# Patient Record
Sex: Female | Born: 1955
Health system: Southern US, Community
[De-identification: ages and names within clinical notes are randomized; demographics above are authoritative.]

## PROBLEM LIST (undated history)

## (undated) DIAGNOSIS — F32A Depression, unspecified: Secondary | ICD-10-CM

## (undated) DIAGNOSIS — Z8701 Personal history of pneumonia (recurrent): Secondary | ICD-10-CM

## (undated) DIAGNOSIS — E785 Hyperlipidemia, unspecified: Secondary | ICD-10-CM

## (undated) DIAGNOSIS — F909 Attention-deficit hyperactivity disorder, unspecified type: Secondary | ICD-10-CM

## (undated) DIAGNOSIS — Z8709 Personal history of other diseases of the respiratory system: Secondary | ICD-10-CM

## (undated) DIAGNOSIS — F419 Anxiety disorder, unspecified: Secondary | ICD-10-CM

## (undated) DIAGNOSIS — M1990 Unspecified osteoarthritis, unspecified site: Secondary | ICD-10-CM

## (undated) DIAGNOSIS — T7840XA Allergy, unspecified, initial encounter: Secondary | ICD-10-CM

## (undated) HISTORY — DX: Anxiety disorder, unspecified: F41.9

## (undated) HISTORY — PX: HEEL SPUR SURGERY: SHX665

## (undated) HISTORY — DX: Allergy, unspecified, initial encounter: T78.40XA

## (undated) HISTORY — DX: Hyperlipidemia, unspecified: E78.5

## (undated) HISTORY — DX: Depression, unspecified: F32.A

## (undated) HISTORY — PX: APPENDECTOMY: SHX54

## (undated) HISTORY — DX: Unspecified osteoarthritis, unspecified site: M19.90

---

## 1998-01-10 LAB — HM HEPATITIS C SCREENING LAB: HM Hepatitis Screen: NEGATIVE

## 1998-01-10 LAB — HM HIV SCREENING LAB: HM HIV Screening: NEGATIVE

## 1998-08-18 ENCOUNTER — Other Ambulatory Visit: Admission: RE | Admit: 1998-08-18 | Discharge: 1998-08-18 | Payer: Self-pay | Admitting: Gynecology

## 2001-01-16 ENCOUNTER — Other Ambulatory Visit: Admission: RE | Admit: 2001-01-16 | Discharge: 2001-01-16 | Payer: Self-pay | Admitting: Gynecology

## 2004-05-12 ENCOUNTER — Ambulatory Visit (HOSPITAL_COMMUNITY): Admission: RE | Admit: 2004-05-12 | Discharge: 2004-05-12 | Payer: Self-pay | Admitting: Family Medicine

## 2004-05-12 ENCOUNTER — Observation Stay (HOSPITAL_COMMUNITY): Admission: RE | Admit: 2004-05-12 | Discharge: 2004-05-13 | Payer: Self-pay | Admitting: *Deleted

## 2004-08-11 ENCOUNTER — Other Ambulatory Visit: Admission: RE | Admit: 2004-08-11 | Discharge: 2004-08-11 | Payer: Self-pay | Admitting: Family Medicine

## 2005-02-02 ENCOUNTER — Encounter: Admission: RE | Admit: 2005-02-02 | Discharge: 2005-02-02 | Payer: Self-pay | Admitting: Family Medicine

## 2005-02-18 ENCOUNTER — Encounter: Admission: RE | Admit: 2005-02-18 | Discharge: 2005-02-18 | Payer: Self-pay | Admitting: Family Medicine

## 2010-12-10 ENCOUNTER — Other Ambulatory Visit: Payer: Self-pay | Admitting: Obstetrics and Gynecology

## 2014-08-06 ENCOUNTER — Other Ambulatory Visit (HOSPITAL_COMMUNITY)
Admission: RE | Admit: 2014-08-06 | Discharge: 2014-08-06 | Disposition: A | Discharge status: Home / Self Care | Payer: BLUE CROSS/BLUE SHIELD | Source: Ambulatory Visit | Attending: Family Medicine | Admitting: Family Medicine

## 2014-08-06 ENCOUNTER — Other Ambulatory Visit: Payer: Self-pay | Admitting: Family Medicine

## 2014-08-06 DIAGNOSIS — Z01411 Encounter for gynecological examination (general) (routine) with abnormal findings: Secondary | ICD-10-CM | POA: Insufficient documentation

## 2014-08-07 LAB — CYTOLOGY - PAP

## 2015-04-13 DIAGNOSIS — M545 Low back pain: Secondary | ICD-10-CM | POA: Diagnosis not present

## 2015-04-13 DIAGNOSIS — M9902 Segmental and somatic dysfunction of thoracic region: Secondary | ICD-10-CM | POA: Diagnosis not present

## 2015-04-13 DIAGNOSIS — M9901 Segmental and somatic dysfunction of cervical region: Secondary | ICD-10-CM | POA: Diagnosis not present

## 2015-04-13 DIAGNOSIS — M9903 Segmental and somatic dysfunction of lumbar region: Secondary | ICD-10-CM | POA: Diagnosis not present

## 2015-04-15 DIAGNOSIS — M9902 Segmental and somatic dysfunction of thoracic region: Secondary | ICD-10-CM | POA: Diagnosis not present

## 2015-04-15 DIAGNOSIS — M9901 Segmental and somatic dysfunction of cervical region: Secondary | ICD-10-CM | POA: Diagnosis not present

## 2015-04-15 DIAGNOSIS — M9903 Segmental and somatic dysfunction of lumbar region: Secondary | ICD-10-CM | POA: Diagnosis not present

## 2015-04-15 DIAGNOSIS — M545 Low back pain: Secondary | ICD-10-CM | POA: Diagnosis not present

## 2015-05-13 DIAGNOSIS — C44529 Squamous cell carcinoma of skin of other part of trunk: Secondary | ICD-10-CM | POA: Diagnosis not present

## 2015-05-13 DIAGNOSIS — B079 Viral wart, unspecified: Secondary | ICD-10-CM | POA: Diagnosis not present

## 2015-08-13 DIAGNOSIS — D225 Melanocytic nevi of trunk: Secondary | ICD-10-CM | POA: Diagnosis not present

## 2015-08-13 DIAGNOSIS — L570 Actinic keratosis: Secondary | ICD-10-CM | POA: Diagnosis not present

## 2015-08-24 ENCOUNTER — Other Ambulatory Visit: Payer: Self-pay | Admitting: Family Medicine

## 2015-08-24 DIAGNOSIS — Z1231 Encounter for screening mammogram for malignant neoplasm of breast: Secondary | ICD-10-CM

## 2015-08-24 DIAGNOSIS — F322 Major depressive disorder, single episode, severe without psychotic features: Secondary | ICD-10-CM | POA: Diagnosis not present

## 2015-08-24 DIAGNOSIS — E7800 Pure hypercholesterolemia, unspecified: Secondary | ICD-10-CM | POA: Diagnosis not present

## 2015-08-24 DIAGNOSIS — Z0000 Encounter for general adult medical examination without abnormal findings: Secondary | ICD-10-CM | POA: Diagnosis not present

## 2015-08-24 DIAGNOSIS — Z1239 Encounter for other screening for malignant neoplasm of breast: Secondary | ICD-10-CM | POA: Diagnosis not present

## 2015-09-07 ENCOUNTER — Encounter: Payer: Self-pay | Admitting: Radiology

## 2015-09-07 ENCOUNTER — Ambulatory Visit
Admission: RE | Admit: 2015-09-07 | Discharge: 2015-09-07 | Disposition: A | Discharge status: Home / Self Care | Payer: BLUE CROSS/BLUE SHIELD | Source: Ambulatory Visit | Attending: Family Medicine | Admitting: Family Medicine

## 2015-09-07 DIAGNOSIS — Z1231 Encounter for screening mammogram for malignant neoplasm of breast: Secondary | ICD-10-CM

## 2015-10-29 DIAGNOSIS — Z79899 Other long term (current) drug therapy: Secondary | ICD-10-CM | POA: Diagnosis not present

## 2015-10-29 DIAGNOSIS — E7800 Pure hypercholesterolemia, unspecified: Secondary | ICD-10-CM | POA: Diagnosis not present

## 2015-11-24 DIAGNOSIS — S93601A Unspecified sprain of right foot, initial encounter: Secondary | ICD-10-CM | POA: Diagnosis not present

## 2015-11-30 DIAGNOSIS — F322 Major depressive disorder, single episode, severe without psychotic features: Secondary | ICD-10-CM | POA: Diagnosis not present

## 2015-11-30 DIAGNOSIS — F411 Generalized anxiety disorder: Secondary | ICD-10-CM | POA: Diagnosis not present

## 2016-01-10 DIAGNOSIS — R5383 Other fatigue: Secondary | ICD-10-CM | POA: Diagnosis not present

## 2016-01-10 DIAGNOSIS — J209 Acute bronchitis, unspecified: Secondary | ICD-10-CM | POA: Diagnosis not present

## 2016-02-02 DIAGNOSIS — F322 Major depressive disorder, single episode, severe without psychotic features: Secondary | ICD-10-CM | POA: Diagnosis not present

## 2016-09-19 DIAGNOSIS — Z79899 Other long term (current) drug therapy: Secondary | ICD-10-CM | POA: Diagnosis not present

## 2016-09-19 DIAGNOSIS — E7800 Pure hypercholesterolemia, unspecified: Secondary | ICD-10-CM | POA: Diagnosis not present

## 2016-09-19 DIAGNOSIS — Z0000 Encounter for general adult medical examination without abnormal findings: Secondary | ICD-10-CM | POA: Diagnosis not present

## 2016-10-12 ENCOUNTER — Other Ambulatory Visit: Payer: Self-pay | Admitting: Family Medicine

## 2016-10-12 DIAGNOSIS — Z1231 Encounter for screening mammogram for malignant neoplasm of breast: Secondary | ICD-10-CM

## 2016-10-26 ENCOUNTER — Ambulatory Visit
Admission: RE | Admit: 2016-10-26 | Discharge: 2016-10-26 | Disposition: A | Discharge status: Home / Self Care | Payer: BLUE CROSS/BLUE SHIELD | Source: Ambulatory Visit | Attending: Family Medicine | Admitting: Family Medicine

## 2016-10-26 DIAGNOSIS — Z1231 Encounter for screening mammogram for malignant neoplasm of breast: Secondary | ICD-10-CM | POA: Diagnosis not present

## 2017-03-01 DIAGNOSIS — L853 Xerosis cutis: Secondary | ICD-10-CM | POA: Diagnosis not present

## 2017-03-16 DIAGNOSIS — J00 Acute nasopharyngitis [common cold]: Secondary | ICD-10-CM | POA: Diagnosis not present

## 2017-05-18 DIAGNOSIS — F17200 Nicotine dependence, unspecified, uncomplicated: Secondary | ICD-10-CM | POA: Diagnosis not present

## 2017-05-18 DIAGNOSIS — Z79899 Other long term (current) drug therapy: Secondary | ICD-10-CM | POA: Diagnosis not present

## 2017-05-18 DIAGNOSIS — E871 Hypo-osmolality and hyponatremia: Secondary | ICD-10-CM | POA: Diagnosis not present

## 2017-05-18 DIAGNOSIS — F411 Generalized anxiety disorder: Secondary | ICD-10-CM | POA: Diagnosis not present

## 2017-05-18 DIAGNOSIS — E7800 Pure hypercholesterolemia, unspecified: Secondary | ICD-10-CM | POA: Diagnosis not present

## 2017-05-18 DIAGNOSIS — F322 Major depressive disorder, single episode, severe without psychotic features: Secondary | ICD-10-CM | POA: Diagnosis not present

## 2017-05-23 ENCOUNTER — Other Ambulatory Visit: Payer: Self-pay | Admitting: Family Medicine

## 2017-05-23 ENCOUNTER — Ambulatory Visit
Admission: RE | Admit: 2017-05-23 | Discharge: 2017-05-23 | Disposition: A | Discharge status: Home / Self Care | Payer: BLUE CROSS/BLUE SHIELD | Source: Ambulatory Visit | Attending: Family Medicine | Admitting: Family Medicine

## 2017-05-23 DIAGNOSIS — E871 Hypo-osmolality and hyponatremia: Secondary | ICD-10-CM | POA: Diagnosis not present

## 2017-05-23 DIAGNOSIS — R05 Cough: Secondary | ICD-10-CM | POA: Diagnosis not present

## 2017-10-24 ENCOUNTER — Other Ambulatory Visit: Payer: Self-pay | Admitting: Family Medicine

## 2017-10-24 ENCOUNTER — Other Ambulatory Visit (HOSPITAL_COMMUNITY)
Admission: RE | Admit: 2017-10-24 | Discharge: 2017-10-24 | Disposition: A | Discharge status: Home / Self Care | Payer: BLUE CROSS/BLUE SHIELD | Source: Ambulatory Visit | Attending: Family Medicine | Admitting: Family Medicine

## 2017-10-24 DIAGNOSIS — Z0000 Encounter for general adult medical examination without abnormal findings: Secondary | ICD-10-CM | POA: Diagnosis not present

## 2017-10-24 DIAGNOSIS — Z01419 Encounter for gynecological examination (general) (routine) without abnormal findings: Secondary | ICD-10-CM | POA: Diagnosis not present

## 2017-10-24 DIAGNOSIS — Z124 Encounter for screening for malignant neoplasm of cervix: Secondary | ICD-10-CM | POA: Diagnosis not present

## 2017-10-24 DIAGNOSIS — E7800 Pure hypercholesterolemia, unspecified: Secondary | ICD-10-CM | POA: Diagnosis not present

## 2017-10-24 DIAGNOSIS — Z79899 Other long term (current) drug therapy: Secondary | ICD-10-CM | POA: Diagnosis not present

## 2017-10-25 ENCOUNTER — Other Ambulatory Visit: Payer: Self-pay | Admitting: Family Medicine

## 2017-10-25 DIAGNOSIS — Z1231 Encounter for screening mammogram for malignant neoplasm of breast: Secondary | ICD-10-CM

## 2017-10-30 LAB — CYTOLOGY - PAP: Diagnosis: NEGATIVE

## 2017-11-29 ENCOUNTER — Ambulatory Visit
Admission: RE | Admit: 2017-11-29 | Discharge: 2017-11-29 | Disposition: A | Discharge status: Home / Self Care | Payer: BLUE CROSS/BLUE SHIELD | Source: Ambulatory Visit | Attending: Family Medicine | Admitting: Family Medicine

## 2017-11-29 DIAGNOSIS — Z1231 Encounter for screening mammogram for malignant neoplasm of breast: Secondary | ICD-10-CM | POA: Diagnosis not present

## 2017-12-11 DIAGNOSIS — F322 Major depressive disorder, single episode, severe without psychotic features: Secondary | ICD-10-CM | POA: Diagnosis not present

## 2017-12-11 DIAGNOSIS — F411 Generalized anxiety disorder: Secondary | ICD-10-CM | POA: Diagnosis not present

## 2017-12-14 DIAGNOSIS — F4320 Adjustment disorder, unspecified: Secondary | ICD-10-CM | POA: Diagnosis not present

## 2017-12-20 DIAGNOSIS — F331 Major depressive disorder, recurrent, moderate: Secondary | ICD-10-CM | POA: Diagnosis not present

## 2017-12-26 DIAGNOSIS — F331 Major depressive disorder, recurrent, moderate: Secondary | ICD-10-CM | POA: Diagnosis not present

## 2018-01-08 DIAGNOSIS — F331 Major depressive disorder, recurrent, moderate: Secondary | ICD-10-CM | POA: Diagnosis not present

## 2018-01-15 DIAGNOSIS — F331 Major depressive disorder, recurrent, moderate: Secondary | ICD-10-CM | POA: Diagnosis not present

## 2018-02-28 DIAGNOSIS — D485 Neoplasm of uncertain behavior of skin: Secondary | ICD-10-CM | POA: Diagnosis not present

## 2018-02-28 DIAGNOSIS — D2262 Melanocytic nevi of left upper limb, including shoulder: Secondary | ICD-10-CM | POA: Diagnosis not present

## 2018-02-28 DIAGNOSIS — D2261 Melanocytic nevi of right upper limb, including shoulder: Secondary | ICD-10-CM | POA: Diagnosis not present

## 2018-02-28 DIAGNOSIS — D225 Melanocytic nevi of trunk: Secondary | ICD-10-CM | POA: Diagnosis not present

## 2018-02-28 DIAGNOSIS — D2272 Melanocytic nevi of left lower limb, including hip: Secondary | ICD-10-CM | POA: Diagnosis not present

## 2018-02-28 DIAGNOSIS — C44519 Basal cell carcinoma of skin of other part of trunk: Secondary | ICD-10-CM | POA: Diagnosis not present

## 2018-04-02 DIAGNOSIS — K045 Chronic apical periodontitis: Secondary | ICD-10-CM | POA: Diagnosis not present

## 2018-06-06 DIAGNOSIS — E7800 Pure hypercholesterolemia, unspecified: Secondary | ICD-10-CM | POA: Diagnosis not present

## 2018-06-06 DIAGNOSIS — F322 Major depressive disorder, single episode, severe without psychotic features: Secondary | ICD-10-CM | POA: Diagnosis not present

## 2018-06-06 DIAGNOSIS — F17200 Nicotine dependence, unspecified, uncomplicated: Secondary | ICD-10-CM | POA: Diagnosis not present

## 2018-06-06 DIAGNOSIS — F411 Generalized anxiety disorder: Secondary | ICD-10-CM | POA: Diagnosis not present

## 2018-07-26 DIAGNOSIS — C44519 Basal cell carcinoma of skin of other part of trunk: Secondary | ICD-10-CM | POA: Diagnosis not present

## 2018-11-13 DIAGNOSIS — Z0000 Encounter for general adult medical examination without abnormal findings: Secondary | ICD-10-CM | POA: Diagnosis not present

## 2019-01-30 DIAGNOSIS — L570 Actinic keratosis: Secondary | ICD-10-CM | POA: Diagnosis not present

## 2019-01-30 DIAGNOSIS — D2261 Melanocytic nevi of right upper limb, including shoulder: Secondary | ICD-10-CM | POA: Diagnosis not present

## 2019-01-30 DIAGNOSIS — D2262 Melanocytic nevi of left upper limb, including shoulder: Secondary | ICD-10-CM | POA: Diagnosis not present

## 2019-01-30 DIAGNOSIS — X32XXXA Exposure to sunlight, initial encounter: Secondary | ICD-10-CM | POA: Diagnosis not present

## 2019-01-30 DIAGNOSIS — D225 Melanocytic nevi of trunk: Secondary | ICD-10-CM | POA: Diagnosis not present

## 2019-01-30 DIAGNOSIS — Z85828 Personal history of other malignant neoplasm of skin: Secondary | ICD-10-CM | POA: Diagnosis not present

## 2019-02-13 DIAGNOSIS — Z20822 Contact with and (suspected) exposure to covid-19: Secondary | ICD-10-CM | POA: Diagnosis not present

## 2019-10-23 ENCOUNTER — Other Ambulatory Visit: Payer: Self-pay | Admitting: Family Medicine

## 2019-10-23 DIAGNOSIS — Z1231 Encounter for screening mammogram for malignant neoplasm of breast: Secondary | ICD-10-CM

## 2019-11-25 ENCOUNTER — Ambulatory Visit: Payer: BLUE CROSS/BLUE SHIELD

## 2019-11-26 ENCOUNTER — Ambulatory Visit
Admission: RE | Admit: 2019-11-26 | Discharge: 2019-11-26 | Disposition: A | Discharge status: Home / Self Care | Payer: BLUE CROSS/BLUE SHIELD | Source: Ambulatory Visit | Attending: Family Medicine | Admitting: Family Medicine

## 2019-11-26 ENCOUNTER — Other Ambulatory Visit: Payer: Self-pay

## 2019-11-26 DIAGNOSIS — Z1231 Encounter for screening mammogram for malignant neoplasm of breast: Secondary | ICD-10-CM

## 2019-12-02 ENCOUNTER — Other Ambulatory Visit: Payer: Self-pay | Admitting: Family Medicine

## 2019-12-02 DIAGNOSIS — F17200 Nicotine dependence, unspecified, uncomplicated: Secondary | ICD-10-CM | POA: Diagnosis not present

## 2019-12-02 DIAGNOSIS — F411 Generalized anxiety disorder: Secondary | ICD-10-CM | POA: Diagnosis not present

## 2019-12-02 DIAGNOSIS — F322 Major depressive disorder, single episode, severe without psychotic features: Secondary | ICD-10-CM | POA: Diagnosis not present

## 2019-12-02 DIAGNOSIS — Z79899 Other long term (current) drug therapy: Secondary | ICD-10-CM | POA: Diagnosis not present

## 2019-12-02 DIAGNOSIS — R928 Other abnormal and inconclusive findings on diagnostic imaging of breast: Secondary | ICD-10-CM

## 2019-12-02 DIAGNOSIS — E7800 Pure hypercholesterolemia, unspecified: Secondary | ICD-10-CM | POA: Diagnosis not present

## 2019-12-12 ENCOUNTER — Ambulatory Visit
Admission: RE | Admit: 2019-12-12 | Discharge: 2019-12-12 | Disposition: A | Discharge status: Home / Self Care | Payer: BC Managed Care – PPO | Source: Ambulatory Visit | Attending: Family Medicine | Admitting: Family Medicine

## 2019-12-12 ENCOUNTER — Other Ambulatory Visit: Payer: Self-pay

## 2019-12-12 DIAGNOSIS — N6001 Solitary cyst of right breast: Secondary | ICD-10-CM | POA: Diagnosis not present

## 2019-12-12 DIAGNOSIS — R928 Other abnormal and inconclusive findings on diagnostic imaging of breast: Secondary | ICD-10-CM

## 2020-09-07 ENCOUNTER — Other Ambulatory Visit: Payer: Self-pay

## 2020-09-07 ENCOUNTER — Ambulatory Visit (INDEPENDENT_AMBULATORY_CARE_PROVIDER_SITE_OTHER): Payer: 59

## 2020-09-07 ENCOUNTER — Encounter: Payer: Self-pay | Admitting: Podiatry

## 2020-09-07 ENCOUNTER — Ambulatory Visit (INDEPENDENT_AMBULATORY_CARE_PROVIDER_SITE_OTHER): Payer: 59 | Admitting: Podiatry

## 2020-09-07 ENCOUNTER — Other Ambulatory Visit: Payer: Self-pay | Admitting: Podiatry

## 2020-09-07 DIAGNOSIS — M722 Plantar fascial fibromatosis: Secondary | ICD-10-CM

## 2020-09-07 DIAGNOSIS — M778 Other enthesopathies, not elsewhere classified: Secondary | ICD-10-CM

## 2020-09-07 MED ORDER — TRIAMCINOLONE ACETONIDE 40 MG/ML IJ SUSP
20.0000 mg | Freq: Once | INTRAMUSCULAR | Status: AC
  Administered 2020-09-07: 20 mg

## 2020-09-07 MED ORDER — MELOXICAM 15 MG PO TABS: 15.0000 mg | ORAL_TABLET | Freq: Every day | ORAL | 3 refills | Status: DC

## 2020-09-07 MED ORDER — METHYLPREDNISOLONE 4 MG PO TBPK: ORAL_TABLET | ORAL | 0 refills | Status: DC

## 2020-09-07 NOTE — Patient Instructions (Signed)

## 2020-09-07 NOTE — Progress Notes (Signed)
  Subjective:  Patient ID: [name], female    DOB: 09/29/1955,  MRN: [id number] HPI Chief Complaint  Patient presents with   Foot Pain    Plantar heel left - aching x 3 weeks, AM pain, intermittent intensity, tried advil and ice, some better, wearing supportive shoes   New Patient (Initial Visit)    65 y.o. female presents with the above complaint.   ROS: Denies fever chills nausea vomiting muscle aches pains calf pain back pain chest pain shortness of breath.  No past medical history on file. No past surgical history on file.  Current Outpatient Medications:    meloxicam (MOBIC) 15 MG tablet, Take 1 tablet (15 mg total) by mouth daily., Disp: 30 tablet, Rfl: 3   methylPREDNISolone (MEDROL DOSEPAK) 4 MG TBPK tablet, 6 day dose pack - take as directed, Disp: 21 tablet, Rfl: 0   albuterol (VENTOLIN HFA) 108 (90 Base) MCG/ACT inhaler, Inhale 2 puffs into the lungs every 4 (four) hours as needed., Disp: , Rfl:    escitalopram (LEXAPRO) 20 MG tablet, Take 20 mg by mouth daily., Disp: , Rfl:    lovastatin (MEVACOR) 10 MG tablet, Take 10 mg by mouth daily., Disp: , Rfl:   Allergies  Allergen Reactions   Penicillins    Review of Systems Objective:  There were no vitals filed for this visit.  General: Well developed, nourished, in no acute distress, alert and oriented x3   Dermatological: Skin is warm, dry and supple bilateral. Nails x 10 are well maintained; remaining integument appears unremarkable at this time. There are no open sores, no preulcerative lesions, no rash or signs of infection present.  Vascular: Dorsalis Pedis artery and Posterior Tibial artery pedal pulses are 2/4 bilateral with immedate capillary fill time. Pedal hair growth present. No varicosities and no lower extremity edema present bilateral.   Neruologic: Grossly intact via light touch bilateral. Vibratory intact via tuning fork bilateral. Protective threshold with Semmes Wienstein monofilament intact to all  pedal sites bilateral. Patellar and Achilles deep tendon reflexes 2+ bilateral. No Babinski or clonus noted bilateral.   Musculoskeletal: No gross boney pedal deformities bilateral. No pain, crepitus, or limitation noted with foot and ankle range of motion bilateral. Muscular strength 5/5 in all groups tested bilateral.  She has pain on palpation medial calcaneal tubercle of the left heel no pain on medial lateral compression of the calcaneus.  Gait: Unassisted, Nonantalgic.    Radiographs:  Radiographs taken today demonstrate soft tissue increase in density plantar fascial pain insertion site no fractures identified.  Assessment & Plan:   Assessment: Planter fasciitis left  Plan: Discussed etiology pathology conservative versus surgical therapies.  I injected the heel today 20 mg Kenalog 5 mg Marcaine.  Provide her plantar fascial brace.  Provided her with a prescription for methylprednisolone to be followed by meloxicam.  Discussed appropriate shoe gear stretching exercise ice therapy and shoe gear modifications.  Follow-up with her in 1 month     Sander Speckman T. [place], [place]

## 2020-10-07 ENCOUNTER — Ambulatory Visit (INDEPENDENT_AMBULATORY_CARE_PROVIDER_SITE_OTHER): Payer: 59 | Admitting: Podiatry

## 2020-10-07 ENCOUNTER — Other Ambulatory Visit: Payer: Self-pay

## 2020-10-07 ENCOUNTER — Encounter: Payer: Self-pay | Admitting: Podiatry

## 2020-10-07 DIAGNOSIS — M722 Plantar fascial fibromatosis: Secondary | ICD-10-CM | POA: Diagnosis not present

## 2020-10-07 MED ORDER — TRIAMCINOLONE ACETONIDE 40 MG/ML IJ SUSP
20.0000 mg | Freq: Once | INTRAMUSCULAR | Status: AC
  Administered 2020-10-07: 20 mg

## 2020-10-07 NOTE — Progress Notes (Signed)
She presents today for follow-up of her left heel states that the area keeps moving around on the heel where it hurts.  Objective: Vital signs are stable she is alert oriented x3 still has pain on palpation medial calcaneal tubercle of the left heel.  Assessment: Plan fasciitis left heel.  Plan: Reinject the left heel today follow-up with her in 1 month

## 2020-11-25 ENCOUNTER — Encounter: Payer: 59 | Admitting: Podiatry

## 2020-12-30 ENCOUNTER — Encounter: Payer: 59 | Admitting: Podiatry

## 2021-01-18 DIAGNOSIS — E7800 Pure hypercholesterolemia, unspecified: Secondary | ICD-10-CM | POA: Diagnosis not present

## 2021-01-22 ENCOUNTER — Other Ambulatory Visit: Payer: Self-pay | Admitting: Podiatry

## 2021-01-28 DIAGNOSIS — M5416 Radiculopathy, lumbar region: Secondary | ICD-10-CM | POA: Diagnosis not present

## 2021-01-28 DIAGNOSIS — M9902 Segmental and somatic dysfunction of thoracic region: Secondary | ICD-10-CM | POA: Diagnosis not present

## 2021-01-28 DIAGNOSIS — M9903 Segmental and somatic dysfunction of lumbar region: Secondary | ICD-10-CM | POA: Diagnosis not present

## 2021-01-28 DIAGNOSIS — M546 Pain in thoracic spine: Secondary | ICD-10-CM | POA: Diagnosis not present

## 2021-01-28 DIAGNOSIS — M545 Low back pain, unspecified: Secondary | ICD-10-CM | POA: Diagnosis not present

## 2021-01-28 DIAGNOSIS — M542 Cervicalgia: Secondary | ICD-10-CM | POA: Diagnosis not present

## 2021-01-28 DIAGNOSIS — M9901 Segmental and somatic dysfunction of cervical region: Secondary | ICD-10-CM | POA: Diagnosis not present

## 2021-02-08 DIAGNOSIS — M546 Pain in thoracic spine: Secondary | ICD-10-CM | POA: Diagnosis not present

## 2021-02-08 DIAGNOSIS — M9901 Segmental and somatic dysfunction of cervical region: Secondary | ICD-10-CM | POA: Diagnosis not present

## 2021-02-08 DIAGNOSIS — M9902 Segmental and somatic dysfunction of thoracic region: Secondary | ICD-10-CM | POA: Diagnosis not present

## 2021-02-08 DIAGNOSIS — M9903 Segmental and somatic dysfunction of lumbar region: Secondary | ICD-10-CM | POA: Diagnosis not present

## 2021-02-08 DIAGNOSIS — M545 Low back pain, unspecified: Secondary | ICD-10-CM | POA: Diagnosis not present

## 2021-02-09 DIAGNOSIS — M9901 Segmental and somatic dysfunction of cervical region: Secondary | ICD-10-CM | POA: Diagnosis not present

## 2021-02-09 DIAGNOSIS — M9902 Segmental and somatic dysfunction of thoracic region: Secondary | ICD-10-CM | POA: Diagnosis not present

## 2021-02-09 DIAGNOSIS — M9903 Segmental and somatic dysfunction of lumbar region: Secondary | ICD-10-CM | POA: Diagnosis not present

## 2021-02-09 DIAGNOSIS — M546 Pain in thoracic spine: Secondary | ICD-10-CM | POA: Diagnosis not present

## 2021-02-09 DIAGNOSIS — M545 Low back pain, unspecified: Secondary | ICD-10-CM | POA: Diagnosis not present

## 2021-02-11 DIAGNOSIS — M9901 Segmental and somatic dysfunction of cervical region: Secondary | ICD-10-CM | POA: Diagnosis not present

## 2021-02-11 DIAGNOSIS — M545 Low back pain, unspecified: Secondary | ICD-10-CM | POA: Diagnosis not present

## 2021-02-11 DIAGNOSIS — M9903 Segmental and somatic dysfunction of lumbar region: Secondary | ICD-10-CM | POA: Diagnosis not present

## 2021-02-11 DIAGNOSIS — M9902 Segmental and somatic dysfunction of thoracic region: Secondary | ICD-10-CM | POA: Diagnosis not present

## 2021-02-11 DIAGNOSIS — M546 Pain in thoracic spine: Secondary | ICD-10-CM | POA: Diagnosis not present

## 2021-02-16 DIAGNOSIS — M9902 Segmental and somatic dysfunction of thoracic region: Secondary | ICD-10-CM | POA: Diagnosis not present

## 2021-02-16 DIAGNOSIS — M545 Low back pain, unspecified: Secondary | ICD-10-CM | POA: Diagnosis not present

## 2021-02-16 DIAGNOSIS — M546 Pain in thoracic spine: Secondary | ICD-10-CM | POA: Diagnosis not present

## 2021-02-16 DIAGNOSIS — M9903 Segmental and somatic dysfunction of lumbar region: Secondary | ICD-10-CM | POA: Diagnosis not present

## 2021-02-16 DIAGNOSIS — M9901 Segmental and somatic dysfunction of cervical region: Secondary | ICD-10-CM | POA: Diagnosis not present

## 2021-02-17 DIAGNOSIS — M9903 Segmental and somatic dysfunction of lumbar region: Secondary | ICD-10-CM | POA: Diagnosis not present

## 2021-02-17 DIAGNOSIS — M546 Pain in thoracic spine: Secondary | ICD-10-CM | POA: Diagnosis not present

## 2021-02-17 DIAGNOSIS — M545 Low back pain, unspecified: Secondary | ICD-10-CM | POA: Diagnosis not present

## 2021-02-17 DIAGNOSIS — M9902 Segmental and somatic dysfunction of thoracic region: Secondary | ICD-10-CM | POA: Diagnosis not present

## 2021-02-17 DIAGNOSIS — M9901 Segmental and somatic dysfunction of cervical region: Secondary | ICD-10-CM | POA: Diagnosis not present

## 2021-02-18 DIAGNOSIS — M546 Pain in thoracic spine: Secondary | ICD-10-CM | POA: Diagnosis not present

## 2021-02-18 DIAGNOSIS — M9901 Segmental and somatic dysfunction of cervical region: Secondary | ICD-10-CM | POA: Diagnosis not present

## 2021-02-18 DIAGNOSIS — M9903 Segmental and somatic dysfunction of lumbar region: Secondary | ICD-10-CM | POA: Diagnosis not present

## 2021-02-18 DIAGNOSIS — M9902 Segmental and somatic dysfunction of thoracic region: Secondary | ICD-10-CM | POA: Diagnosis not present

## 2021-02-18 DIAGNOSIS — M545 Low back pain, unspecified: Secondary | ICD-10-CM | POA: Diagnosis not present

## 2021-02-23 DIAGNOSIS — M9903 Segmental and somatic dysfunction of lumbar region: Secondary | ICD-10-CM | POA: Diagnosis not present

## 2021-02-23 DIAGNOSIS — M9902 Segmental and somatic dysfunction of thoracic region: Secondary | ICD-10-CM | POA: Diagnosis not present

## 2021-02-23 DIAGNOSIS — M546 Pain in thoracic spine: Secondary | ICD-10-CM | POA: Diagnosis not present

## 2021-02-23 DIAGNOSIS — M9901 Segmental and somatic dysfunction of cervical region: Secondary | ICD-10-CM | POA: Diagnosis not present

## 2021-02-23 DIAGNOSIS — M545 Low back pain, unspecified: Secondary | ICD-10-CM | POA: Diagnosis not present

## 2021-02-25 DIAGNOSIS — M9902 Segmental and somatic dysfunction of thoracic region: Secondary | ICD-10-CM | POA: Diagnosis not present

## 2021-02-25 DIAGNOSIS — M546 Pain in thoracic spine: Secondary | ICD-10-CM | POA: Diagnosis not present

## 2021-02-25 DIAGNOSIS — M9903 Segmental and somatic dysfunction of lumbar region: Secondary | ICD-10-CM | POA: Diagnosis not present

## 2021-02-25 DIAGNOSIS — M545 Low back pain, unspecified: Secondary | ICD-10-CM | POA: Diagnosis not present

## 2021-02-25 DIAGNOSIS — M9901 Segmental and somatic dysfunction of cervical region: Secondary | ICD-10-CM | POA: Diagnosis not present

## 2021-02-26 DIAGNOSIS — M9902 Segmental and somatic dysfunction of thoracic region: Secondary | ICD-10-CM | POA: Diagnosis not present

## 2021-02-26 DIAGNOSIS — M545 Low back pain, unspecified: Secondary | ICD-10-CM | POA: Diagnosis not present

## 2021-02-26 DIAGNOSIS — M546 Pain in thoracic spine: Secondary | ICD-10-CM | POA: Diagnosis not present

## 2021-02-26 DIAGNOSIS — M9903 Segmental and somatic dysfunction of lumbar region: Secondary | ICD-10-CM | POA: Diagnosis not present

## 2021-02-26 DIAGNOSIS — M9901 Segmental and somatic dysfunction of cervical region: Secondary | ICD-10-CM | POA: Diagnosis not present

## 2021-03-02 DIAGNOSIS — M9903 Segmental and somatic dysfunction of lumbar region: Secondary | ICD-10-CM | POA: Diagnosis not present

## 2021-03-02 DIAGNOSIS — M546 Pain in thoracic spine: Secondary | ICD-10-CM | POA: Diagnosis not present

## 2021-03-02 DIAGNOSIS — M9902 Segmental and somatic dysfunction of thoracic region: Secondary | ICD-10-CM | POA: Diagnosis not present

## 2021-03-02 DIAGNOSIS — M545 Low back pain, unspecified: Secondary | ICD-10-CM | POA: Diagnosis not present

## 2021-03-02 DIAGNOSIS — M9901 Segmental and somatic dysfunction of cervical region: Secondary | ICD-10-CM | POA: Diagnosis not present

## 2021-03-03 DIAGNOSIS — M9902 Segmental and somatic dysfunction of thoracic region: Secondary | ICD-10-CM | POA: Diagnosis not present

## 2021-03-03 DIAGNOSIS — M9903 Segmental and somatic dysfunction of lumbar region: Secondary | ICD-10-CM | POA: Diagnosis not present

## 2021-03-03 DIAGNOSIS — M545 Low back pain, unspecified: Secondary | ICD-10-CM | POA: Diagnosis not present

## 2021-03-03 DIAGNOSIS — M546 Pain in thoracic spine: Secondary | ICD-10-CM | POA: Diagnosis not present

## 2021-03-03 DIAGNOSIS — M9901 Segmental and somatic dysfunction of cervical region: Secondary | ICD-10-CM | POA: Diagnosis not present

## 2021-03-04 DIAGNOSIS — M546 Pain in thoracic spine: Secondary | ICD-10-CM | POA: Diagnosis not present

## 2021-03-04 DIAGNOSIS — M9902 Segmental and somatic dysfunction of thoracic region: Secondary | ICD-10-CM | POA: Diagnosis not present

## 2021-03-04 DIAGNOSIS — M545 Low back pain, unspecified: Secondary | ICD-10-CM | POA: Diagnosis not present

## 2021-03-04 DIAGNOSIS — M9903 Segmental and somatic dysfunction of lumbar region: Secondary | ICD-10-CM | POA: Diagnosis not present

## 2021-03-04 DIAGNOSIS — M9901 Segmental and somatic dysfunction of cervical region: Secondary | ICD-10-CM | POA: Diagnosis not present

## 2021-03-09 DIAGNOSIS — M545 Low back pain, unspecified: Secondary | ICD-10-CM | POA: Diagnosis not present

## 2021-03-09 DIAGNOSIS — M9902 Segmental and somatic dysfunction of thoracic region: Secondary | ICD-10-CM | POA: Diagnosis not present

## 2021-03-09 DIAGNOSIS — M9903 Segmental and somatic dysfunction of lumbar region: Secondary | ICD-10-CM | POA: Diagnosis not present

## 2021-03-09 DIAGNOSIS — M9901 Segmental and somatic dysfunction of cervical region: Secondary | ICD-10-CM | POA: Diagnosis not present

## 2021-03-09 DIAGNOSIS — M546 Pain in thoracic spine: Secondary | ICD-10-CM | POA: Diagnosis not present

## 2021-03-10 DIAGNOSIS — M9901 Segmental and somatic dysfunction of cervical region: Secondary | ICD-10-CM | POA: Diagnosis not present

## 2021-03-10 DIAGNOSIS — M545 Low back pain, unspecified: Secondary | ICD-10-CM | POA: Diagnosis not present

## 2021-03-10 DIAGNOSIS — M546 Pain in thoracic spine: Secondary | ICD-10-CM | POA: Diagnosis not present

## 2021-03-10 DIAGNOSIS — M9902 Segmental and somatic dysfunction of thoracic region: Secondary | ICD-10-CM | POA: Diagnosis not present

## 2021-03-10 DIAGNOSIS — M9903 Segmental and somatic dysfunction of lumbar region: Secondary | ICD-10-CM | POA: Diagnosis not present

## 2021-03-16 DIAGNOSIS — M9901 Segmental and somatic dysfunction of cervical region: Secondary | ICD-10-CM | POA: Diagnosis not present

## 2021-03-16 DIAGNOSIS — M546 Pain in thoracic spine: Secondary | ICD-10-CM | POA: Diagnosis not present

## 2021-03-16 DIAGNOSIS — M9903 Segmental and somatic dysfunction of lumbar region: Secondary | ICD-10-CM | POA: Diagnosis not present

## 2021-03-16 DIAGNOSIS — M545 Low back pain, unspecified: Secondary | ICD-10-CM | POA: Diagnosis not present

## 2021-03-16 DIAGNOSIS — M9902 Segmental and somatic dysfunction of thoracic region: Secondary | ICD-10-CM | POA: Diagnosis not present

## 2021-03-18 DIAGNOSIS — M545 Low back pain, unspecified: Secondary | ICD-10-CM | POA: Diagnosis not present

## 2021-03-18 DIAGNOSIS — M9903 Segmental and somatic dysfunction of lumbar region: Secondary | ICD-10-CM | POA: Diagnosis not present

## 2021-03-18 DIAGNOSIS — M546 Pain in thoracic spine: Secondary | ICD-10-CM | POA: Diagnosis not present

## 2021-03-18 DIAGNOSIS — M9901 Segmental and somatic dysfunction of cervical region: Secondary | ICD-10-CM | POA: Diagnosis not present

## 2021-03-18 DIAGNOSIS — M9902 Segmental and somatic dysfunction of thoracic region: Secondary | ICD-10-CM | POA: Diagnosis not present

## 2021-03-29 ENCOUNTER — Ambulatory Visit
Admission: EM | Admit: 2021-03-29 | Discharge: 2021-03-29 | Disposition: A | Discharge status: Home / Self Care | Payer: 59 | Attending: Emergency Medicine | Admitting: Emergency Medicine

## 2021-03-29 ENCOUNTER — Other Ambulatory Visit: Payer: Self-pay

## 2021-03-29 ENCOUNTER — Encounter: Payer: Self-pay | Admitting: Emergency Medicine

## 2021-03-29 DIAGNOSIS — H9203 Otalgia, bilateral: Secondary | ICD-10-CM | POA: Diagnosis not present

## 2021-03-29 DIAGNOSIS — J029 Acute pharyngitis, unspecified: Secondary | ICD-10-CM

## 2021-03-29 NOTE — ED Provider Notes (Signed)
?[facility] ? ? ? ?CSN: [id number] ?Arrival date & time: 03/29/21  1253 ? ? ?  ? ?History   ?Chief Complaint ?Chief Complaint  ?Patient presents with  ? Sore Throat  ? Otalgia  ? ? ?HPI ?[name] is a 66 y.o. female.  Patient presents with bilateral ear fullness x1 week.  She also reports sore throat, postnasal drip, and mild nonproductive cough x3 days.  No fever, rash, arthralgias, shortness of breath, vomiting, diarrhea, or other symptoms.  Treatment at home with Tylenol and Sudafed. ? ?The history is provided by the patient.  ? ?History reviewed. No pertinent past medical history. ? ?There are no problems to display for this patient. ? ? ?History reviewed. No pertinent surgical history. ? ?OB History   ?No obstetric history on file. ?  ? ? ? ?Home Medications   ? ?Prior to Admission medications   ?Medication Sig Start Date End Date Taking? Authorizing Provider  ?ALPRAZolam (XANAX) 0.5 MG tablet Take 0.5 mg by mouth at bedtime as needed. 09/22/20  Yes [provider]  ?escitalopram (LEXAPRO) 20 MG tablet Take 20 mg by mouth daily. 06/19/20  Yes [provider]  ?lovastatin (MEVACOR) 10 MG tablet Take 10 mg by mouth daily. 06/19/20  Yes [provider]  ?meloxicam (MOBIC) 15 MG tablet TAKE 1 TABLET (15 MG TOTAL) BY MOUTH DAILY. 01/22/21  Yes Hyatt, Max T, DPM  ?albuterol (VENTOLIN HFA) 108 (90 Base) MCG/ACT inhaler Inhale 2 puffs into the lungs every 4 (four) hours as needed. 06/14/20   [provider]  ? ? ?Family History ?No family history on file. ? ?Social History ?Social History  ? ?Tobacco Use  ? Smoking status: Every Day  ?  Types: Cigarettes  ? Smokeless tobacco: Never  ?Vaping Use  ? Vaping Use: Never used  ?Substance Use Topics  ? Alcohol use: Yes  ? Drug use: Never  ? ? ? ?Allergies   ?Penicillins and Pravastatin sodium ? ? ?Review of Systems ?Review of Systems  ?Constitutional:  Negative for chills and fever.  ?HENT:  Positive for ear pain, postnasal drip and  sore throat.   ?Respiratory:  Positive for cough. Negative for shortness of breath.   ?Cardiovascular:  Negative for chest pain and palpitations.  ?Gastrointestinal:  Negative for diarrhea and vomiting.  ?Skin:  Negative for color change and rash.  ?All other systems reviewed and are negative. ? ? ?Physical Exam ?Triage Vital Signs ?ED Triage Vitals  ?Enc Vitals Group  ?   BP   ?   Pulse   ?   Resp   ?   Temp   ?   Temp src   ?   SpO2   ?   Weight   ?   Height   ?   Head Circumference   ?   Peak Flow   ?   Pain Score   ?   Pain Loc   ?   Pain Edu?   ?   Excl. in GC?   ? ?No data found. ? ?Updated Vital Signs ?BP 127/83   Pulse (!) 111   Temp 98.1 ?F (36.7 ?C)   Resp 18   SpO2 98%  ? ?Visual Acuity ?Right Eye Distance:   ?Left Eye Distance:   ?Bilateral Distance:   ? ?Right Eye Near:   ?Left Eye Near:    ?Bilateral Near:    ? ?Physical Exam ?Vitals and nursing note reviewed.  ?Constitutional:   ?  General: She is not in acute distress. ?   Appearance: She is well-developed. She is not ill-appearing.  ?HENT:  ?   Right Ear: Tympanic membrane and ear canal normal.  ?   Left Ear: Tympanic membrane and ear canal normal.  ?   Nose: Nose normal.  ?   Mouth/Throat:  ?   Mouth: Mucous membranes are moist.  ?   Pharynx: Posterior oropharyngeal erythema present.  ?Cardiovascular:  ?   Rate and Rhythm: Normal rate and regular rhythm.  ?   Heart sounds: Normal heart sounds.  ?Pulmonary:  ?   Effort: Pulmonary effort is normal. No respiratory distress.  ?   Breath sounds: Normal breath sounds.  ?Musculoskeletal:  ?   Cervical back: Neck supple.  ?Skin: ?   General: Skin is warm and dry.  ?Neurological:  ?   Mental Status: She is alert.  ?Psychiatric:     ?   Mood and Affect: Mood normal.     ?   Behavior: Behavior normal.  ? ? ? ?UC Treatments / Results  ?Labs ?(all labs ordered are listed, but only abnormal results are displayed) ?Labs Reviewed  ?POCT RAPID STREP A (OFFICE)  ? ? ?EKG ? ? ?Radiology ?No results  found. ? ?Procedures ?Procedures (including critical care time) ? ?Medications Ordered in UC ?Medications - No data to display ? ?Initial Impression / Assessment and Plan / UC Course  ?I have reviewed the triage vital signs and the nursing notes. ? ?Pertinent labs & imaging results that were available during my care of the patient were reviewed by me and considered in my medical decision making (see chart for details). ? ? Sore throat, bilateral otalgia.  Rapid strep negative.  Patient declines COVID testing today; she had negative at-home test.  Discussed symptomatic treatment including Tylenol or ibuprofen, plain Mucinex, rest, hydration.  Instructed patient to follow up with her PCP if her symptoms are not improving.  She agrees to plan of care.  ? ? ? ?Final Clinical Impressions(s) / UC Diagnoses  ? ?Final diagnoses:  ?Sore throat  ?Otalgia of both ears  ? ? ? ?Discharge Instructions   ? ?  ?Your strep test is negative.    ? ?Take Tylenol or ibuprofen as needed for fever or discomfort.  Take plain Mucinex as directed.  Rest and keep yourself hydrated.   ? ?Follow-up with your primary care provider if your symptoms are not improving.   ? ? ? ? ? ? ?ED Prescriptions   ?None ?  ? ?PDMP not reviewed this encounter. ?  ?Mickie Bail, NP ?03/29/21 1333 ? ?

## 2021-03-29 NOTE — Discharge Instructions (Addendum)
Your strep test is negative.    ? ?Take Tylenol or ibuprofen as needed for fever or discomfort.  Take plain Mucinex as directed.  Rest and keep yourself hydrated.   ? ?Follow-up with your primary care provider if your symptoms are not improving.   ? ? ?

## 2021-03-29 NOTE — ED Triage Notes (Signed)
Pt presents with bilateral ear discomfort x 1 week and a ST x 3 days  ?

## 2021-04-02 DIAGNOSIS — J329 Chronic sinusitis, unspecified: Secondary | ICD-10-CM | POA: Diagnosis not present

## 2021-04-15 DIAGNOSIS — Z08 Encounter for follow-up examination after completed treatment for malignant neoplasm: Secondary | ICD-10-CM | POA: Diagnosis not present

## 2021-04-15 DIAGNOSIS — L821 Other seborrheic keratosis: Secondary | ICD-10-CM | POA: Diagnosis not present

## 2021-04-15 DIAGNOSIS — Z85828 Personal history of other malignant neoplasm of skin: Secondary | ICD-10-CM | POA: Diagnosis not present

## 2021-04-15 DIAGNOSIS — L918 Other hypertrophic disorders of the skin: Secondary | ICD-10-CM | POA: Diagnosis not present

## 2021-04-15 DIAGNOSIS — D2261 Melanocytic nevi of right upper limb, including shoulder: Secondary | ICD-10-CM | POA: Diagnosis not present

## 2021-04-15 DIAGNOSIS — D225 Melanocytic nevi of trunk: Secondary | ICD-10-CM | POA: Diagnosis not present

## 2021-04-16 DIAGNOSIS — R0982 Postnasal drip: Secondary | ICD-10-CM | POA: Diagnosis not present

## 2021-04-16 DIAGNOSIS — H6982 Other specified disorders of Eustachian tube, left ear: Secondary | ICD-10-CM | POA: Diagnosis not present

## 2021-04-20 DIAGNOSIS — J029 Acute pharyngitis, unspecified: Secondary | ICD-10-CM | POA: Diagnosis not present

## 2021-04-20 DIAGNOSIS — B349 Viral infection, unspecified: Secondary | ICD-10-CM | POA: Diagnosis not present

## 2021-05-19 ENCOUNTER — Other Ambulatory Visit: Payer: Self-pay | Admitting: Podiatry

## 2021-05-20 DIAGNOSIS — E7800 Pure hypercholesterolemia, unspecified: Secondary | ICD-10-CM | POA: Diagnosis not present

## 2021-05-20 DIAGNOSIS — Z79899 Other long term (current) drug therapy: Secondary | ICD-10-CM | POA: Diagnosis not present

## 2021-06-22 NOTE — Progress Notes (Signed)
[facility] [address] [place] [address] Phone: [phone] Subjective:   Bruce Donath, am serving as a scribe for Dr. Antoine Primas.  I'm seeing this patient by the request  of:  Blair Heys, MD  CC: knee pain   [id number]  [name] is a 66 y.o. female coming in with complaint of L knee pain. Did fall years ago on L patella and states that lately she has had increase in pain over medial aspect. Steps and walking on incline increase her pain. Pain will subside once she stops moving.   Also complains of tingling and numbness in lateral aspect of L foot. L foot will hurt if she goes for a long walk. Also notes muscle twitches at night in L calf. Does see chiropractor for L upper thigh pain. Pain in thigh has dissipated. Does have back pain with lumbar flexion.        No past medical history on file. No past surgical history on file. Social History   Socioeconomic History   Marital status: Divorced    Spouse name: Not on file   Number of children: Not on file   Years of education: Not on file   Highest education level: Not on file  Occupational History   Not on file  Tobacco Use   Smoking status: Every Day    Types: Cigarettes   Smokeless tobacco: Never  Vaping Use   Vaping Use: Never used  Substance and Sexual Activity   Alcohol use: Yes   Drug use: Never   Sexual activity: Not on file  Other Topics Concern   Not on file  Social History Narrative   Not on file   Social Determinants of Health   Financial Resource Strain: Not on file  Food Insecurity: Not on file  Transportation Needs: Not on file  Physical Activity: Not on file  Stress: Not on file  Social Connections: Not on file   Allergies  Allergen Reactions   Penicillins    Pravastatin Sodium     Other reaction(s): leg cramps   No family history on file.   Current Outpatient Medications (Cardiovascular):    lovastatin (MEVACOR) 10 MG tablet,  Take 10 mg by mouth daily.  Current Outpatient Medications (Respiratory):    albuterol (VENTOLIN HFA) 108 (90 Base) MCG/ACT inhaler, Inhale 2 puffs into the lungs every 4 (four) hours as needed.  Current Outpatient Medications (Analgesics):    meloxicam (MOBIC) 15 MG tablet, TAKE 1 TABLET (15 MG TOTAL) BY MOUTH DAILY.   Current Outpatient Medications (Other):    ALPRAZolam (XANAX) 0.5 MG tablet, Take 0.5 mg by mouth at bedtime as needed.   escitalopram (LEXAPRO) 20 MG tablet, Take 20 mg by mouth daily.   Reviewed prior external information including notes and imaging from  primary care provider As well as notes that were available from care everywhere and other healthcare systems.  Past medical history, social, surgical and family history all reviewed in electronic medical record.  No pertanent information unless stated regarding to the chief complaint.   Review of Systems:  No headache, visual changes, nausea, vomiting, diarrhea, constipation, dizziness, abdominal pain, skin rash, fevers, chills, night sweats, weight loss, swollen lymph nodes, body aches, joint swelling, chest pain, shortness of breath, mood changes. POSITIVE muscle aches  Objective  Blood pressure 122/84, pulse 82, height 5\' 3"  (1.6 m), weight 159 lb (72.1 kg), SpO2 98 %.   General: No apparent distress alert and  oriented x3 mood and affect normal, dressed appropriately.  HEENT: Pupils equal, extraocular movements intact  Respiratory: Patient's speak in full sentences and does not appear short of breath  Cardiovascular: No lower extremity edema, non tender, no erythema  Left knee exam does have some tenderness to palpation over the medial joint line.  Patient has a trace effusion of the patellofemoral.  Mild positive McMurray's noted.  No significant fullness of the popliteal area.  Limited ultrasound shows the patient does have some degenerative hypoechoic changes noted over the medial meniscus.  Unable to say  pictures though.  Questionable displacement noted as well.    Impression and Recommendations:     The above documentation has been reviewed and is accurate and complete Judi Saa, DO

## 2021-06-23 ENCOUNTER — Ambulatory Visit (INDEPENDENT_AMBULATORY_CARE_PROVIDER_SITE_OTHER): Payer: 59

## 2021-06-23 ENCOUNTER — Ambulatory Visit: Payer: 59 | Admitting: Family Medicine

## 2021-06-23 VITALS — BP 122/84 | HR 82 | Ht 63.0 in | Wt 159.0 lb

## 2021-06-23 DIAGNOSIS — M25562 Pain in left knee: Secondary | ICD-10-CM

## 2021-06-23 DIAGNOSIS — M545 Low back pain, unspecified: Secondary | ICD-10-CM | POA: Diagnosis not present

## 2021-06-23 DIAGNOSIS — M5136 Other intervertebral disc degeneration, lumbar region: Secondary | ICD-10-CM | POA: Diagnosis not present

## 2021-06-23 NOTE — Patient Instructions (Addendum)
Xray today Exercises Avoid twisting motions Ice for 20 min 2x a day Voltraen gel Use Meloxicam  See me in 6-8 weeks

## 2021-06-24 DIAGNOSIS — M2550 Pain in unspecified joint: Secondary | ICD-10-CM | POA: Insufficient documentation

## 2021-06-24 DIAGNOSIS — M25562 Pain in left knee: Secondary | ICD-10-CM | POA: Insufficient documentation

## 2021-06-24 NOTE — Assessment & Plan Note (Signed)
Patient's left knee pain does seem to have an acute on chronic possible meniscal tear noted with some mild displacement.  Meloxicam encouraged for the next 10 days.  Discussed avoiding twisting motions.  Does have some arthritic changes noted of the knee as well.  Concern over some of the numbness that there could be some radicular symptoms that could be playing a role as well.  We will get further x-rays of the lumbar spine to further evaluate as well.  Patient hopefully will do well with conservative therapy and follow-up with me again in 6 to 8 weeks.  Worsening symptoms consider injection and formal physical therapy.

## 2021-06-29 ENCOUNTER — Encounter: Payer: Self-pay | Admitting: Family Medicine

## 2021-08-04 NOTE — Progress Notes (Unsigned)
[facility] [address] [place] [address] Phone: [phone] Subjective:   Bruce Donath, am serving as a scribe for Dr. Antoine Primas.  I'm seeing this patient by the request  of:  Blair Heys, MD  CC: Knee pain follow-up  [id number]  06/23/2021 Patient's left knee pain does seem to have an acute on chronic possible meniscal tear noted with some mild displacement.  Meloxicam encouraged for the next 10 days.  Discussed avoiding twisting motions.  Does have some arthritic changes noted of the knee as well.  Concern over some of the numbness that there could be some radicular symptoms that could be playing a role as well.  We will get further x-rays of the lumbar spine to further evaluate as well.  Patient hopefully will do well with conservative therapy and follow-up with me again in 6 to 8 weeks.  Worsening symptoms consider injection and formal physical therapy.  Updated 08/05/2021 [name] is a 66 y.o. female coming in with complaint of left knee pain. Twisting with foot planted still hurts but overall her pain has improved.   Xray IMPRESSION: Moderate medial compartment and mild patellofemoral compartment osteoarthritis.      No past medical history on file. No past surgical history on file. Social History   Socioeconomic History   Marital status: Divorced    Spouse name: Not on file   Number of children: Not on file   Years of education: Not on file   Highest education level: Not on file  Occupational History   Not on file  Tobacco Use   Smoking status: Every Day    Types: Cigarettes   Smokeless tobacco: Never  Vaping Use   Vaping Use: Never used  Substance and Sexual Activity   Alcohol use: Yes   Drug use: Never   Sexual activity: Not on file  Other Topics Concern   Not on file  Social History Narrative   Not on file   Social Determinants of Health   Financial Resource Strain: Not on file  Food Insecurity:  Not on file  Transportation Needs: Not on file  Physical Activity: Not on file  Stress: Not on file  Social Connections: Not on file   Allergies  Allergen Reactions   Penicillins    Pravastatin Sodium     Other reaction(s): leg cramps   No family history on file.   Current Outpatient Medications (Cardiovascular):    lovastatin (MEVACOR) 10 MG tablet, Take 10 mg by mouth daily.  Current Outpatient Medications (Respiratory):    albuterol (VENTOLIN HFA) 108 (90 Base) MCG/ACT inhaler, Inhale 2 puffs into the lungs every 4 (four) hours as needed.  Current Outpatient Medications (Analgesics):    meloxicam (MOBIC) 15 MG tablet, TAKE 1 TABLET (15 MG TOTAL) BY MOUTH DAILY.   Current Outpatient Medications (Other):    ALPRAZolam (XANAX) 0.5 MG tablet, Take 0.5 mg by mouth at bedtime as needed.   escitalopram (LEXAPRO) 20 MG tablet, Take 20 mg by mouth daily.    Objective  Blood pressure 132/86, pulse 80, height 5\' 3"  (1.6 m), weight 161 lb (73 kg), SpO2 98 %.   General: No apparent distress alert and oriented x3 mood and affect normal, dressed appropriately.  HEENT: Pupils equal, extraocular movements intact  Respiratory: Patient's speak in full sentences and does not appear short of breath  Cardiovascular: No lower extremity edema, non tender, no erythema  Left knee exam does show that still tenderness  to palpation over the medial joint line.  Positive McMurray's noted.  Full range of motion.  No significant effusion noted.   Limited muscular skeletal ultrasound was performed and interpreted by Antoine Primas, M  Patient's left knee still shows a trace effusion noted of the patellofemoral joint.  The patient does have what appears to be still some mild displacement of the posterior medial meniscus.  Decreased on the hypoechoic changes and displacement noted previously. Impression: Interval improvement   Impression and Recommendations:

## 2021-08-05 ENCOUNTER — Encounter: Payer: Self-pay | Admitting: Family Medicine

## 2021-08-05 ENCOUNTER — Ambulatory Visit: Payer: 59 | Admitting: Family Medicine

## 2021-08-05 ENCOUNTER — Ambulatory Visit: Payer: Self-pay

## 2021-08-05 VITALS — BP 132/86 | HR 80 | Ht 63.0 in | Wt 161.0 lb

## 2021-08-05 DIAGNOSIS — M25562 Pain in left knee: Secondary | ICD-10-CM

## 2021-08-05 NOTE — Assessment & Plan Note (Signed)
Patient does seem to be better overall.  Still has some positive antibodies signs noted.  Ultrasound does show some scar tissue formation.  Discussed icing regimen and home exercises.  Follow-up again 6 weeks to make sure completely resolved.

## 2021-08-05 NOTE — Patient Instructions (Signed)
Good to see you Looking better Avoid twisting until Labor Day Ok to increase activity otherwise Ice at end of day See me in 2 months just in case

## 2021-08-25 ENCOUNTER — Encounter: Payer: Self-pay | Admitting: Family Medicine

## 2021-10-06 ENCOUNTER — Ambulatory Visit: Payer: 59 | Admitting: Family Medicine

## 2021-11-19 IMAGING — MG DIGITAL SCREENING BILAT W/ CAD
5 series · 5 of 5 positions shown · non-contrast
Comparison: Previous exam(s).

CLINICAL DATA: Screening.

EXAM:
DIGITAL SCREENING BILATERAL MAMMOGRAM WITH CAD

[L CC]
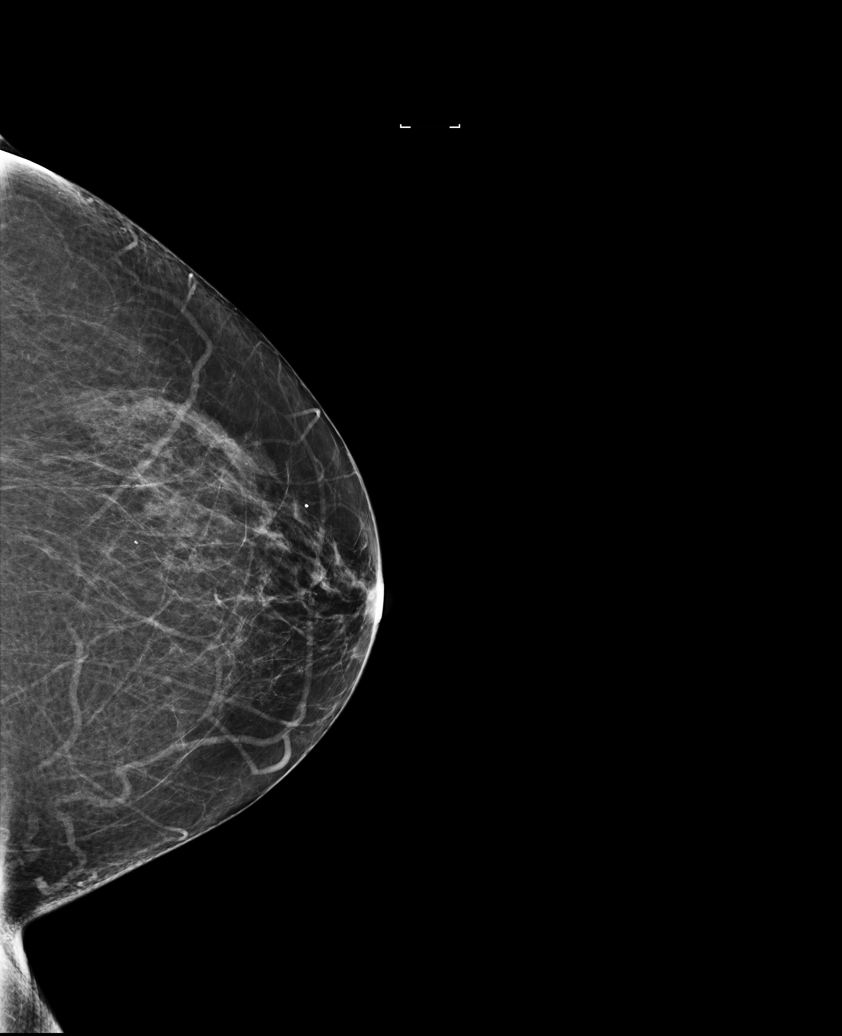

[R MLO]
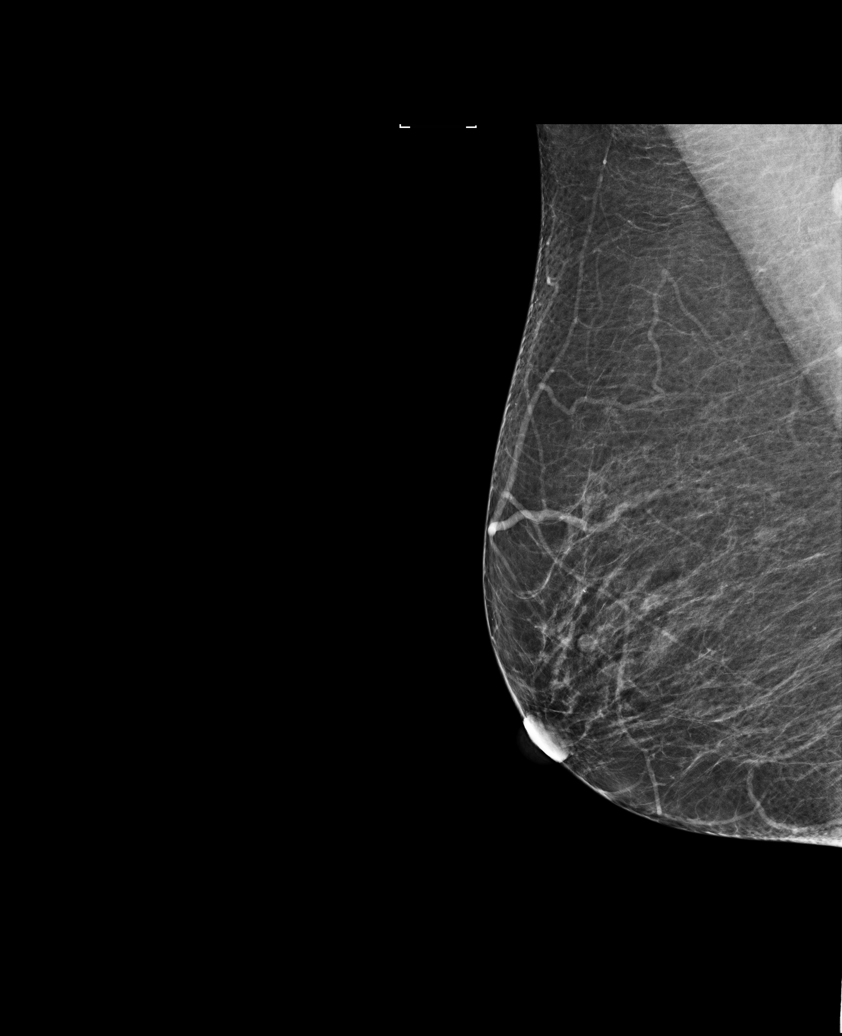

[R CC (1 of 2)]
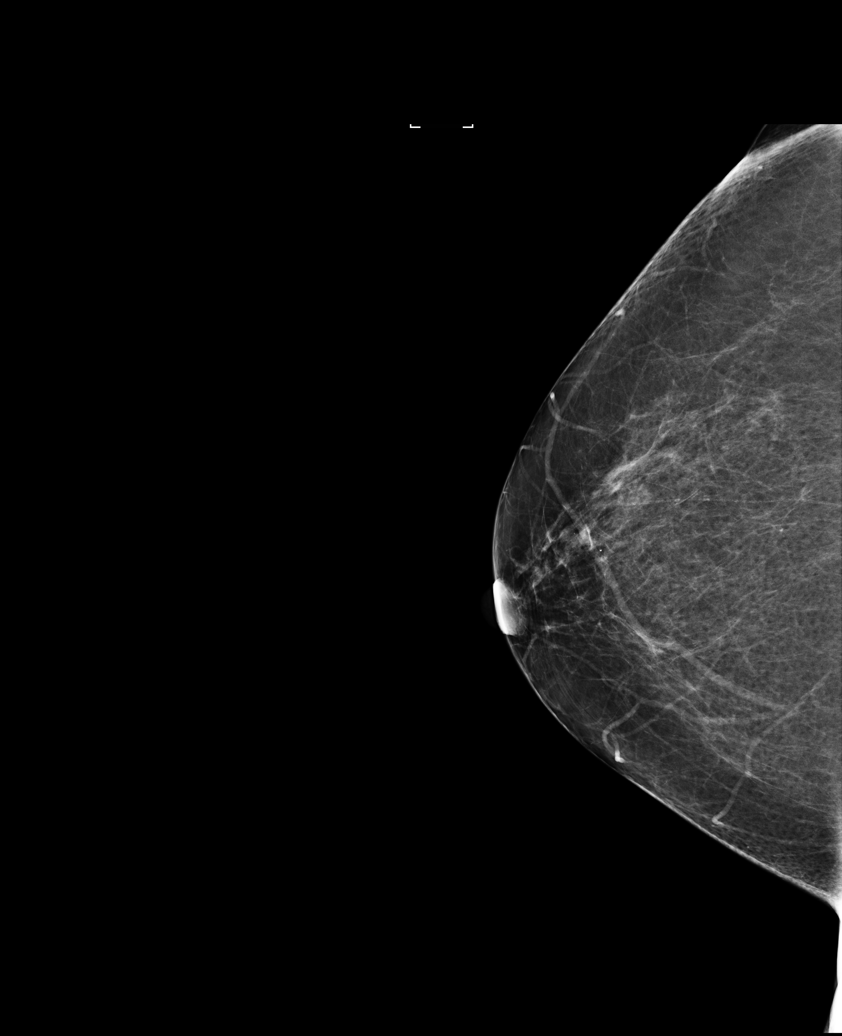

[R CC (2 of 2)]
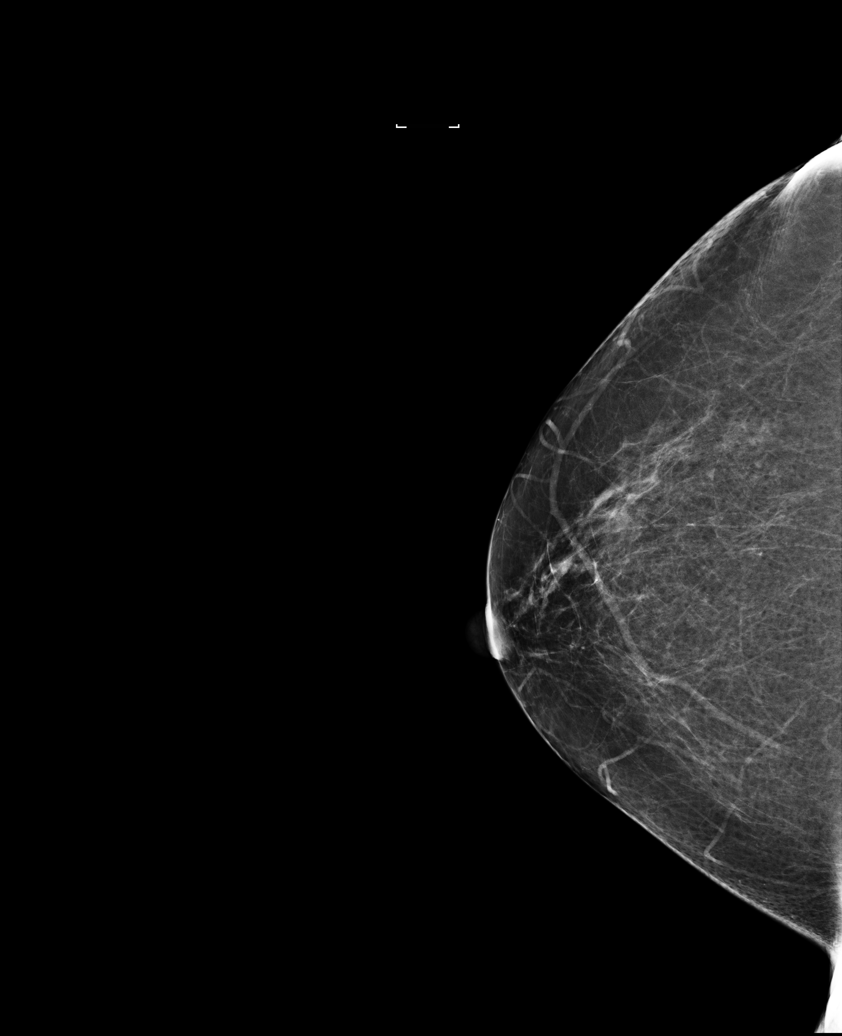

[L MLO]
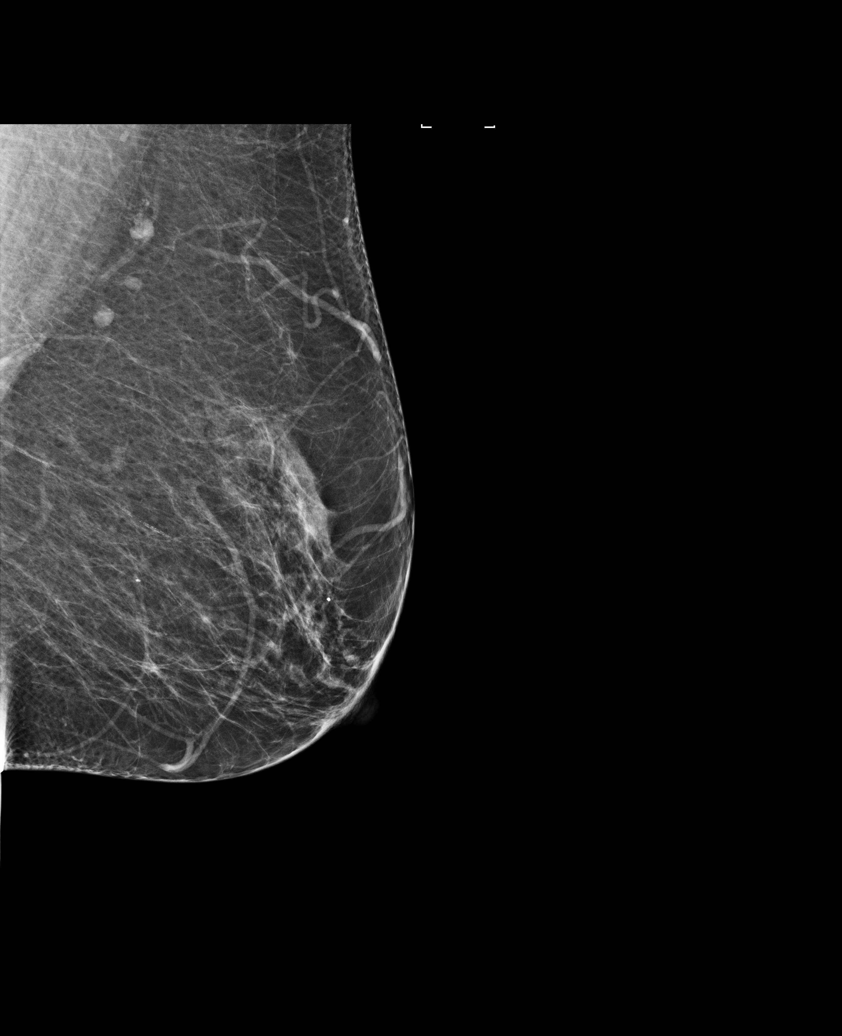

[5 of 5 positions shown; findings below may reference images not displayed]

ACR Breast Density Category b: There are scattered areas of
fibroglandular density.
FINDINGS: In the right breast, a possible mass warrants further evaluation. In
the left breast, no findings suspicious for malignancy. Images were
processed with CAD.
IMPRESSION: Further evaluation is suggested for a possible mass in the right
breast.

RECOMMENDATION:
Diagnostic mammogram and possibly ultrasound of the right breast.
(Code:[id number])

The patient will be contacted regarding the findings, and additional
imaging will be scheduled.

BI-RADS CATEGORY  0: Incomplete. Need additional imaging evaluation
and/or prior mammograms for comparison.

## 2021-12-23 DIAGNOSIS — H524 Presbyopia: Secondary | ICD-10-CM | POA: Diagnosis not present

## 2022-05-13 DIAGNOSIS — Z0100 Encounter for examination of eyes and vision without abnormal findings: Secondary | ICD-10-CM | POA: Diagnosis not present

## 2022-05-13 DIAGNOSIS — H524 Presbyopia: Secondary | ICD-10-CM | POA: Diagnosis not present

## 2022-05-27 ENCOUNTER — Encounter: Payer: Self-pay | Admitting: Family Medicine

## 2022-05-27 ENCOUNTER — Ambulatory Visit (INDEPENDENT_AMBULATORY_CARE_PROVIDER_SITE_OTHER): Payer: Medicare HMO | Admitting: Family Medicine

## 2022-05-27 VITALS — BP 106/80 | HR 85 | Temp 98.3°F | Ht 62.5 in | Wt 155.0 lb

## 2022-05-27 DIAGNOSIS — E785 Hyperlipidemia, unspecified: Secondary | ICD-10-CM | POA: Diagnosis not present

## 2022-05-27 DIAGNOSIS — Z0000 Encounter for general adult medical examination without abnormal findings: Secondary | ICD-10-CM

## 2022-05-27 DIAGNOSIS — M2550 Pain in unspecified joint: Secondary | ICD-10-CM | POA: Diagnosis not present

## 2022-05-27 DIAGNOSIS — F419 Anxiety disorder, unspecified: Secondary | ICD-10-CM

## 2022-05-27 DIAGNOSIS — Z7189 Other specified counseling: Secondary | ICD-10-CM

## 2022-05-27 MED ORDER — MELOXICAM 15 MG PO TABS: 15.0000 mg | ORAL_TABLET | Freq: Every day | ORAL | 1 refills | Status: DC

## 2022-05-27 MED ORDER — ALPRAZOLAM 0.5 MG PO TABS: 0.5000 mg | ORAL_TABLET | Freq: Every evening | ORAL | 1 refills | Status: DC | PRN

## 2022-05-27 NOTE — Progress Notes (Unsigned)
New patient.    Smoking d/w pt.  <1 PPD.  D/w pt about cessation, encouraged.   Requesting records from [place] [facility]. Had prev colonoscopy and mammogram.   Advance directive d/w pt.  Daughter, son, and daughter in law all equally designated if patient were incapacitated.    Travelling to [place], malaria free zone, d/w pt.    Elevated Cholesterol: Using medications without problems: yes Muscle aches: not on lovastatin, d/w pt.  Diet compliance: d/w pt.  Exercise: d/w pt.    Taking meloxicam daily, chronically.  It helps with joint pain.   On lexapro since 2004.  Taking lexapro daily.  Family stressors d/w pt.  "I'm a worrier by nature."  Lexapro helped, less tearful on med.  Used xanax at night.  No ADE on med.  No SI/HI.    PMH and SH reviewed  Meds, vitals, and allergies reviewed.   ROS: Per HPI unless specifically indicated in ROS section   GEN: nad, alert and oriented HEENT: ncat NECK: supple w/o LA CV: rrr. PULM: ctab, no inc wob ABD: soft, +bs EXT: no edema SKIN: no acute rash No tremor.  Speech and judgment intact.  30 minutes were devoted to patient care in this encounter (this includes time spent reviewing the patient's file/history, interviewing and examining the patient, counseling/reviewing plan with patient).

## 2022-05-27 NOTE — Patient Instructions (Addendum)
Don't change your meds for now.  Ask for a fasting lab visit when possible.  Record release today.   Take care.  Glad to see you. Have a great trip.

## 2022-05-29 DIAGNOSIS — Z7189 Other specified counseling: Secondary | ICD-10-CM | POA: Insufficient documentation

## 2022-05-29 DIAGNOSIS — E785 Hyperlipidemia, unspecified: Secondary | ICD-10-CM | POA: Insufficient documentation

## 2022-05-29 DIAGNOSIS — F419 Anxiety disorder, unspecified: Secondary | ICD-10-CM | POA: Insufficient documentation

## 2022-05-29 DIAGNOSIS — Z0000 Encounter for general adult medical examination without abnormal findings: Secondary | ICD-10-CM | POA: Insufficient documentation

## 2022-05-29 NOTE — Assessment & Plan Note (Signed)
On lexapro since 2004.  Taking lexapro daily.  Family stressors d/w pt.  "I'm a worrier by nature."  Lexapro helped, less tearful on med.  Used xanax at night.  No ADE on med.  No SI/HI.   Would continue as is.  Requesting records.

## 2022-05-29 NOTE — Assessment & Plan Note (Signed)
Taking meloxicam daily, chronically.  It helps with joint pain.  No ADE on med.  Requesting records.

## 2022-05-29 NOTE — Assessment & Plan Note (Signed)
Advance directive d/w pt.  Daughter, son, and daughter in law all equally designated if patient were incapacitated.

## 2022-05-29 NOTE — Assessment & Plan Note (Signed)
Continue lovastatin as is.  Requesting records.  Continue work on diet and exercise.

## 2022-05-29 NOTE — Assessment & Plan Note (Signed)
Smoking d/w pt.  <1 PPD.  D/w pt about cessation, encouraged.   Requesting records from [place] [facility]. Had prev colonoscopy and mammogram.   Advance directive d/w pt.  Daughter, son, and daughter in law all equally designated if patient were incapacitated.

## 2022-06-08 ENCOUNTER — Encounter: Payer: Self-pay | Admitting: Family Medicine

## 2022-06-08 NOTE — Telephone Encounter (Signed)
Spoke to patient by telephone and was advised that she just got home last night from [place]. Patient stated that she started with symptoms during the night.  Patient stated that she has had a low grade fever and nasal congestion. Patient denies any chest pain or SOB. Patient stated that she wants to discuss if she should take any medication for Covid. Patient scheduled for a virtual visit tomorrow 06/09/22 with Dr. Para March at 2:00 pm. Patient was given ER precautions and verbalized understanding. Patient was advised that she should quarantine for 5 days.Patient was advised to rest and drink lots of fluids.

## 2022-06-09 ENCOUNTER — Telehealth (INDEPENDENT_AMBULATORY_CARE_PROVIDER_SITE_OTHER): Payer: Medicare HMO | Admitting: Family Medicine

## 2022-06-09 ENCOUNTER — Encounter: Payer: Self-pay | Admitting: Family Medicine

## 2022-06-09 VITALS — BP 117/85 | Temp 98.4°F | Ht 62.5 in | Wt 156.0 lb

## 2022-06-09 DIAGNOSIS — U071 COVID-19: Secondary | ICD-10-CM

## 2022-06-09 MED ORDER — MOLNUPIRAVIR EUA 200MG CAPSULE: 4.0000 | ORAL_CAPSULE | Freq: Two times a day (BID) | ORAL | 0 refills | Status: AC

## 2022-06-09 NOTE — Progress Notes (Signed)
Virtual visit completed through WebEx or similar program Patient location: home  Provider location: [place] [facility], office  Participants: Patient and me (unless stated otherwise below)  Limitations and rationale for visit method d/w patient.  Patient agreed to proceed.   CC: covid.    HPI:  Took test yesterday morning and was positive. Patient has been having chills, joint aches, headaches, stuffy nose, body aches and fatigue. Sx started Tuesday. Has been taking advil for sx. Sx started 2 days ago.  Not SOB.  Minimal cough.  Scant exp wheeze occ noted.  Tmax 99.8.   Meds and allergies reviewed.   ROS: Per HPI unless specifically indicated in ROS section   NAD Speech wnl  A/P: Covid, d/w pt about options.  Discussed antiviral use, rationale for use.  She has symptoms that are mild enough to where she would not yet have to start antivirals.  It is reasonable to hold off for now.  Discussed with patient.  This is what she prefers. Reasonable to hold molnupiravir to see if she improves in the meantime.  Rest fluids mucinex tylenol prn.  Routine cautions given to patient.  She agrees to plan.

## 2022-06-09 NOTE — Telephone Encounter (Signed)
Noted. Thanks.

## 2022-06-12 DIAGNOSIS — U071 COVID-19: Secondary | ICD-10-CM | POA: Insufficient documentation

## 2022-06-12 NOTE — Assessment & Plan Note (Signed)
Covid, d/w pt about options.  Discussed antiviral use, rationale for use.  She has symptoms that are mild enough to where she would not yet have to start antivirals.  It is reasonable to hold off for now.  Discussed with patient.  This is what she prefers. Reasonable to hold molnupiravir to see if she improves in the meantime.  Rest fluids mucinex tylenol prn.  Routine cautions given to patient.  She agrees to plan.

## 2022-06-20 ENCOUNTER — Other Ambulatory Visit (INDEPENDENT_AMBULATORY_CARE_PROVIDER_SITE_OTHER): Payer: Medicare HMO

## 2022-06-20 DIAGNOSIS — E785 Hyperlipidemia, unspecified: Secondary | ICD-10-CM

## 2022-06-20 LAB — CBC WITH DIFFERENTIAL/PLATELET
Basophils Absolute: 0 10*3/uL (ref 0.0–0.1)
Basophils Relative: 0.3 % (ref 0.0–3.0)
Eosinophils Absolute: 0.5 10*3/uL (ref 0.0–0.7)
Eosinophils Relative: 6 % — ABNORMAL HIGH (ref 0.0–5.0)
HCT: 39.3 % (ref 36.0–46.0)
Hemoglobin: 13 g/dL (ref 12.0–15.0)
Lymphocytes Relative: 23 % (ref 12.0–46.0)
Lymphs Abs: 1.8 10*3/uL (ref 0.7–4.0)
MCHC: 33.1 g/dL (ref 30.0–36.0)
MCV: 92.6 fl (ref 78.0–100.0)
Monocytes Absolute: 0.8 10*3/uL (ref 0.1–1.0)
Monocytes Relative: 10.7 % (ref 3.0–12.0)
Neutro Abs: 4.7 10*3/uL (ref 1.4–7.7)
Neutrophils Relative %: 60 % (ref 43.0–77.0)
Platelets: 342 10*3/uL (ref 150.0–400.0)
RBC: 4.25 Mil/uL (ref 3.87–5.11)
RDW: 13 % (ref 11.5–15.5)
WBC: 7.8 10*3/uL (ref 4.0–10.5)

## 2022-06-20 LAB — LIPID PANEL
Cholesterol: 185 mg/dL (ref 0–200)
HDL: 44.8 mg/dL (ref >39.00)
LDL Cholesterol: 110 mg/dL — ABNORMAL HIGH (ref 0–99)
NonHDL: 140.37
Total CHOL/HDL Ratio: 4
Triglycerides: 153 mg/dL — ABNORMAL HIGH (ref 0.0–149.0)
VLDL: 30.6 mg/dL (ref 0.0–40.0)

## 2022-06-20 LAB — COMPREHENSIVE METABOLIC PANEL
ALT: 15 U/L (ref 0–35)
AST: 19 U/L (ref 0–37)
Albumin: 4 g/dL (ref 3.5–5.2)
Alkaline Phosphatase: 56 U/L (ref 39–117)
BUN: 11 mg/dL (ref 6–23)
CO2: 28 mEq/L (ref 19–32)
Calcium: 9.1 mg/dL (ref 8.4–10.5)
Chloride: 101 mEq/L (ref 96–112)
Creatinine, Ser: 0.67 mg/dL (ref 0.40–1.20)
GFR: 91.55 mL/min (ref >60.00)
Glucose, Bld: 90 mg/dL (ref 70–99)
Potassium: 4.1 mEq/L (ref 3.5–5.1)
Sodium: 138 mEq/L (ref 135–145)
Total Bilirubin: 0.6 mg/dL (ref 0.2–1.2)
Total Protein: 6.6 g/dL (ref 6.0–8.3)

## 2022-06-27 ENCOUNTER — Encounter: Payer: Self-pay | Admitting: Family Medicine

## 2022-06-29 MED ORDER — ALPRAZOLAM 0.5 MG PO TABS: 0.5000 mg | ORAL_TABLET | Freq: Every evening | ORAL | 0 refills | Status: DC | PRN

## 2022-07-27 ENCOUNTER — Telehealth: Payer: Self-pay | Admitting: Family Medicine

## 2022-07-27 MED ORDER — ALPRAZOLAM 0.5 MG PO TABS: 0.5000 mg | ORAL_TABLET | Freq: Every evening | ORAL | 1 refills | Status: DC | PRN

## 2022-07-27 NOTE — Telephone Encounter (Signed)
Prescription Request  07/27/2022  LOV: 05/27/2022  What is the name of the medication or equipment? ALPRAZolam (XANAX) 0.5 MG tablet [[id number]]   Have you contacted your pharmacy to request a refill? No   Which pharmacy would you like this sent to?  [id number] [place], [place] - [address] [place] [address] Phone: [phone] Fax: [phone]     Patient notified that their request is being sent to the clinical staff for review and that they should receive a response within 2 business days.   Please advise at Mobile [phone] (mobile)

## 2022-07-27 NOTE — Addendum Note (Signed)
Addended by: Joaquim Nam on: 07/27/2022 04:14 PM   Modules accepted: Orders

## 2022-07-27 NOTE — Telephone Encounter (Signed)
LOV - 05/27/22 NOV - 08/30/22 RF - 06/29/22 #30/0

## 2022-08-10 ENCOUNTER — Encounter (INDEPENDENT_AMBULATORY_CARE_PROVIDER_SITE_OTHER): Payer: Self-pay

## 2022-08-21 ENCOUNTER — Other Ambulatory Visit: Payer: Self-pay | Admitting: Family Medicine

## 2022-08-21 DIAGNOSIS — Z1321 Encounter for screening for nutritional disorder: Secondary | ICD-10-CM

## 2022-08-21 DIAGNOSIS — E785 Hyperlipidemia, unspecified: Secondary | ICD-10-CM

## 2022-08-23 ENCOUNTER — Other Ambulatory Visit (INDEPENDENT_AMBULATORY_CARE_PROVIDER_SITE_OTHER): Payer: Medicare HMO

## 2022-08-23 DIAGNOSIS — Z1321 Encounter for screening for nutritional disorder: Secondary | ICD-10-CM

## 2022-08-23 DIAGNOSIS — E785 Hyperlipidemia, unspecified: Secondary | ICD-10-CM

## 2022-08-23 LAB — LIPID PANEL
Cholesterol: 191 mg/dL (ref 0–200)
HDL: 44.7 mg/dL (ref >39.00)
LDL Cholesterol: 129 mg/dL — ABNORMAL HIGH (ref 0–99)
NonHDL: 146.62
Total CHOL/HDL Ratio: 4
Triglycerides: 90 mg/dL (ref 0.0–149.0)
VLDL: 18 mg/dL (ref 0.0–40.0)

## 2022-08-23 LAB — VITAMIN D 25 HYDROXY (VIT D DEFICIENCY, FRACTURES): VITD: 31.38 ng/mL (ref 30.00–100.00)

## 2022-08-25 ENCOUNTER — Other Ambulatory Visit: Payer: Self-pay | Admitting: Family Medicine

## 2022-08-25 ENCOUNTER — Encounter (INDEPENDENT_AMBULATORY_CARE_PROVIDER_SITE_OTHER): Payer: Self-pay

## 2022-08-25 MED ORDER — LOVASTATIN 10 MG PO TABS: 10.0000 mg | ORAL_TABLET | Freq: Every day | ORAL | 0 refills | Status: DC

## 2022-08-30 ENCOUNTER — Encounter: Payer: Medicare HMO | Admitting: Family Medicine

## 2022-09-19 ENCOUNTER — Ambulatory Visit (INDEPENDENT_AMBULATORY_CARE_PROVIDER_SITE_OTHER): Payer: Medicare HMO | Admitting: Family Medicine

## 2022-09-19 ENCOUNTER — Encounter: Payer: Self-pay | Admitting: Family Medicine

## 2022-09-19 ENCOUNTER — Other Ambulatory Visit: Payer: Self-pay | Admitting: Family Medicine

## 2022-09-19 VITALS — BP 112/70 | HR 82 | Temp 98.3°F | Ht 62.5 in | Wt 159.0 lb

## 2022-09-19 DIAGNOSIS — E785 Hyperlipidemia, unspecified: Secondary | ICD-10-CM | POA: Diagnosis not present

## 2022-09-19 DIAGNOSIS — B001 Herpesviral vesicular dermatitis: Secondary | ICD-10-CM

## 2022-09-19 DIAGNOSIS — F419 Anxiety disorder, unspecified: Secondary | ICD-10-CM | POA: Diagnosis not present

## 2022-09-19 DIAGNOSIS — E2839 Other primary ovarian failure: Secondary | ICD-10-CM

## 2022-09-19 DIAGNOSIS — F17200 Nicotine dependence, unspecified, uncomplicated: Secondary | ICD-10-CM

## 2022-09-19 DIAGNOSIS — Z0000 Encounter for general adult medical examination without abnormal findings: Secondary | ICD-10-CM | POA: Diagnosis not present

## 2022-09-19 DIAGNOSIS — Z1211 Encounter for screening for malignant neoplasm of colon: Secondary | ICD-10-CM

## 2022-09-19 DIAGNOSIS — Z1231 Encounter for screening mammogram for malignant neoplasm of breast: Secondary | ICD-10-CM

## 2022-09-19 DIAGNOSIS — Z7189 Other specified counseling: Secondary | ICD-10-CM

## 2022-09-19 MED ORDER — VALACYCLOVIR HCL 1 G PO TABS: 2000.0000 mg | ORAL_TABLET | Freq: Two times a day (BID) | ORAL | 1 refills | Status: DC

## 2022-09-19 MED ORDER — ALPRAZOLAM 0.5 MG PO TABS: 0.5000 mg | ORAL_TABLET | Freq: Every evening | ORAL | 2 refills | Status: DC | PRN

## 2022-09-19 MED ORDER — VITAMIN D3 50 MCG (2000 UT) PO CAPS
2000.0000 [IU] | ORAL_CAPSULE | Freq: Every day | ORAL | Status: AC
Start: 2022-09-19 — End: ?

## 2022-09-19 NOTE — Patient Instructions (Addendum)
Check with your insurance to see if they will cover the tetanus shot. I would get a flu shot each fall.   Pneumonia 20 vaccine when possible.    You can call for a mammogram and bone density at [facility].  [address] [place] [id number]  You should get a call about seeing GI.  Take care.  Glad to see you.

## 2022-09-19 NOTE — Progress Notes (Unsigned)
I have personally reviewed the Medicare Annual Wellness questionnaire and have noted 1. The patient's medical and social history 2. Their use of alcohol, tobacco or illicit drugs 3. Their current medications and supplements 4. The patient's functional ability including ADL's, fall risks, home safety risks and hearing or visual             impairment. 5. Diet and physical activities 6. Evidence for depression or mood disorders  The patients weight, height, BMI have been recorded in the chart and visual acuity is per eye clinic.  I have made referrals, counseling and provided education to the patient based review of the above and I have provided the pt with a written personalized care plan for preventive services.  Provider list updated- see scanned forms.  Routine anticipatory guidance given to patient.  See health maintenance. The possibility exists that previously documented standard health maintenance information may have been brought forward from a previous encounter into this note.  If needed, that same information has been updated to reflect the current situation based on today's encounter.    Flu Shingles PNA Tetanus Colon  Breast cancer screening Advance directive Cognitive function addressed- see scanned forms- and if abnormal then additional documentation follows.   In addition to [facility] Wellness, follow up visit for the below conditions:  [facility], with HIV and HCV screening, early 2000.  Smoking d/w pt.  Encouraged cessation.   Mood d/w pt.  One of her friends recently passed, condolences offered.  D/w pt about chantix and mood/sleep cautions.  Discussed nicotine replacement.  Lexapro and xanax helped with anxiety.  No SI/HI.    Elevated Cholesterol: Using medications without problems: yes Muscle aches: no Diet compliance: d/w pt.  Exercise: d/w pt.   H/o cold sores, worse under high stress periods.    She has altered sensation in the L leg  at night.  That is longstanding with prev back injury ~2000.  No daytime paresthesia.  Improved with rolling over at night.  No foot drop.  She has knee pain at baseline, is going to f/u with Dr. Katrinka Blazing.    PMH and SH reviewed  Meds, vitals, and allergies reviewed.   ROS: Per HPI.  Unless specifically indicated otherwise in HPI, the patient denies:  General: fever. Eyes: acute vision changes ENT: sore throat Cardiovascular: chest pain Respiratory: SOB GI: vomiting GU: dysuria Musculoskeletal: acute back pain Derm: acute rash Neuro: acute motor dysfunction Psych: worsening mood Endocrine: polydipsia Heme: bleeding Allergy: hayfever  GEN: nad, alert and oriented HEENT: mucous membranes moist NECK: supple w/o LA CV: rrr. PULM: ctab, no inc wob ABD: soft, +bs EXT: no edema SKIN: no acute rash

## 2022-09-20 ENCOUNTER — Encounter: Payer: Self-pay | Admitting: *Deleted

## 2022-09-21 DIAGNOSIS — Z0000 Encounter for general adult medical examination without abnormal findings: Secondary | ICD-10-CM | POA: Insufficient documentation

## 2022-09-21 DIAGNOSIS — B001 Herpesviral vesicular dermatitis: Secondary | ICD-10-CM | POA: Insufficient documentation

## 2022-09-21 DIAGNOSIS — F17200 Nicotine dependence, unspecified, uncomplicated: Secondary | ICD-10-CM | POA: Insufficient documentation

## 2022-09-21 NOTE — Assessment & Plan Note (Signed)
Continue lovastatin.  Labs discussed with patient.

## 2022-09-21 NOTE — Assessment & Plan Note (Signed)
Can use Valtrex with routine cautions and update me as needed.

## 2022-09-21 NOTE — Assessment & Plan Note (Signed)
One of her friends recently passed, condolences offered.   Lexapro and xanax helped with anxiety.  No SI/HI.   Continue as is

## 2022-09-21 NOTE — Assessment & Plan Note (Signed)
Nicotine replacement would be a reasonable option.  She can consider.

## 2022-09-21 NOTE — Assessment & Plan Note (Signed)
Advance directive-daughter, son, daughter-in-law all designated equally if patient were incapacitated.

## 2022-09-21 NOTE — Assessment & Plan Note (Signed)
Flu to be done this fall. Shingles previously done PNA discussed with patient Tetanus discussed with patient.  May be cheaper at pharmacy. COVID-vaccine discussed with patient. Colon cancer screening discussed.  GI referral placed. Breast cancer screening -mammogram ordered 2024. Bone density ordered 2024. Advance directive-daughter, son, daughter-in-law all designated equally if patient were incapacitated. Cognitive function addressed- see scanned forms- and if abnormal then additional documentation follows.

## 2022-10-14 ENCOUNTER — Other Ambulatory Visit: Payer: Self-pay | Admitting: Family Medicine

## 2022-10-14 DIAGNOSIS — Z1212 Encounter for screening for malignant neoplasm of rectum: Secondary | ICD-10-CM

## 2022-10-14 DIAGNOSIS — Z1211 Encounter for screening for malignant neoplasm of colon: Secondary | ICD-10-CM

## 2022-11-01 ENCOUNTER — Encounter: Payer: Self-pay | Admitting: Family Medicine

## 2022-11-02 ENCOUNTER — Other Ambulatory Visit: Payer: Self-pay | Admitting: Family Medicine

## 2022-11-02 DIAGNOSIS — Z659 Problem related to unspecified psychosocial circumstances: Secondary | ICD-10-CM

## 2022-11-24 ENCOUNTER — Other Ambulatory Visit: Payer: Self-pay | Admitting: Family Medicine

## 2022-12-07 DIAGNOSIS — F411 Generalized anxiety disorder: Secondary | ICD-10-CM | POA: Diagnosis not present

## 2022-12-07 DIAGNOSIS — F330 Major depressive disorder, recurrent, mild: Secondary | ICD-10-CM | POA: Diagnosis not present

## 2022-12-15 ENCOUNTER — Ambulatory Visit
Admission: RE | Admit: 2022-12-15 | Discharge: 2022-12-15 | Disposition: A | Discharge status: Home / Self Care | Payer: Medicare HMO | Source: Ambulatory Visit | Attending: Family Medicine | Admitting: Family Medicine

## 2022-12-15 DIAGNOSIS — Z1231 Encounter for screening mammogram for malignant neoplasm of breast: Secondary | ICD-10-CM | POA: Insufficient documentation

## 2022-12-25 ENCOUNTER — Other Ambulatory Visit: Payer: Self-pay | Admitting: Family Medicine

## 2022-12-26 ENCOUNTER — Encounter: Payer: Self-pay | Admitting: Family Medicine

## 2023-01-13 ENCOUNTER — Ambulatory Visit
Admission: RE | Admit: 2023-01-13 | Discharge: 2023-01-13 | Disposition: A | Discharge status: Home / Self Care | Payer: Medicare (Managed Care) | Source: Ambulatory Visit | Attending: Family Medicine | Admitting: Family Medicine

## 2023-01-13 DIAGNOSIS — E2839 Other primary ovarian failure: Secondary | ICD-10-CM | POA: Diagnosis present

## 2023-01-16 ENCOUNTER — Other Ambulatory Visit: Payer: Self-pay | Admitting: Family Medicine

## 2023-02-08 DIAGNOSIS — F411 Generalized anxiety disorder: Secondary | ICD-10-CM | POA: Diagnosis not present

## 2023-02-08 DIAGNOSIS — F900 Attention-deficit hyperactivity disorder, predominantly inattentive type: Secondary | ICD-10-CM | POA: Diagnosis not present

## 2023-02-08 DIAGNOSIS — F4329 Adjustment disorder with other symptoms: Secondary | ICD-10-CM | POA: Diagnosis not present

## 2023-02-08 DIAGNOSIS — F330 Major depressive disorder, recurrent, mild: Secondary | ICD-10-CM | POA: Diagnosis not present

## 2023-02-21 ENCOUNTER — Other Ambulatory Visit: Payer: Self-pay | Admitting: Family Medicine

## 2023-03-03 ENCOUNTER — Encounter: Payer: Self-pay | Admitting: Family Medicine

## 2023-03-03 ENCOUNTER — Other Ambulatory Visit: Payer: Self-pay | Admitting: Family Medicine

## 2023-03-03 MED ORDER — LOVASTATIN 10 MG PO TABS: 10.0000 mg | ORAL_TABLET | Freq: Every day | ORAL | 0 refills | Status: DC

## 2023-03-30 ENCOUNTER — Encounter: Payer: Self-pay | Admitting: Family Medicine

## 2023-03-31 ENCOUNTER — Other Ambulatory Visit: Payer: Self-pay | Admitting: Family Medicine

## 2023-03-31 NOTE — Telephone Encounter (Signed)
 Copied from CRM [id number]. Topic: Clinical - Medication Refill >> Mar 31, 2023 10:12 AM Kathryne Eriksson wrote: Most Recent Primary Care Visit:  Provider: Joaquim Nam  Department: [facility]  Visit Type: PHYSICAL  Date: 09/19/2022  Medication: ALPRAZolam Prudy Feeler) 0.5 MG tablet  Has the patient contacted their pharmacy? Yes (Agent: If no, request that the patient contact the pharmacy for the refill. If patient does not wish to contact the pharmacy document the reason why and proceed with request.) (Agent: If yes, when and what did the pharmacy advise?)  Is this the correct pharmacy for this prescription? Yes If no, delete pharmacy and type the correct one.  This is the patient's preferred pharmacy:  [id number] [place], [place] - [address] AT [facility] OF [address] [place] [address] Phone: [phone] Fax: [phone]   Has the prescription been filled recently? No  Is the patient out of the medication? Yes  Has the patient been seen for an appointment in the last year OR does the patient have an upcoming appointment? Yes  Can we respond through MyChart? Yes  Agent: Please be advised that Rx refills may take up to 3 business days. We ask that you follow-up with your pharmacy.

## 2023-04-02 ENCOUNTER — Other Ambulatory Visit: Payer: Self-pay | Admitting: Family Medicine

## 2023-04-02 MED ORDER — ALPRAZOLAM 0.5 MG PO TABS: 0.5000 mg | ORAL_TABLET | Freq: Every evening | ORAL | 2 refills | Status: DC | PRN

## 2023-04-05 NOTE — Telephone Encounter (Signed)
 Medication refilled 04/02/23 by Dr. Para March

## 2023-04-26 DIAGNOSIS — F411 Generalized anxiety disorder: Secondary | ICD-10-CM | POA: Diagnosis not present

## 2023-04-26 DIAGNOSIS — F4329 Adjustment disorder with other symptoms: Secondary | ICD-10-CM | POA: Diagnosis not present

## 2023-04-26 DIAGNOSIS — F330 Major depressive disorder, recurrent, mild: Secondary | ICD-10-CM | POA: Diagnosis not present

## 2023-04-26 DIAGNOSIS — F900 Attention-deficit hyperactivity disorder, predominantly inattentive type: Secondary | ICD-10-CM | POA: Diagnosis not present

## 2023-05-01 ENCOUNTER — Other Ambulatory Visit: Payer: Self-pay | Admitting: Family Medicine

## 2023-05-17 NOTE — Progress Notes (Unsigned)
    Ben Jackson D.[facility] [address] [place] [address] Phone: [phone]   Assessment and Plan:     There are no diagnoses linked to this encounter.  ***   Pertinent previous records reviewed include ***    Follow Up: ***     Subjective:   I, Brooks Kinnan, am serving as a Neurosurgeon for Doctor Ulysees Gander  Chief Complaint: left knee pain   HPI:   05/18/2023 Patient is a 68 year old female with left knee pain. Patient states   Relevant Historical Information: ***  Additional pertinent review of systems negative.   Current Outpatient Medications:    ALPRAZolam  (XANAX ) 0.5 MG tablet, Take 1 tablet (0.5 mg total) by mouth at bedtime as needed., Disp: 30 tablet, Rfl: 2   Cholecalciferol (VITAMIN D3) 50 MCG (2000 UT) capsule, Take 1 capsule (2,000 Units total) by mouth daily., Disp: , Rfl:    escitalopram (LEXAPRO) 20 MG tablet, TAKE 1 TABLET BY MOUTH EVERY DAY, Disp: 90 tablet, Rfl: 4   lovastatin  (MEVACOR ) 10 MG tablet, Take 1 tablet (10 mg total) by mouth daily., Disp: 90 tablet, Rfl: 0   meloxicam  (MOBIC ) 15 MG tablet, TAKE 1 TABLET BY MOUTH EVERY DAY, Disp: 90 tablet, Rfl: 1   valACYclovir  (VALTREX ) 1000 MG tablet, Take 2 tablets (2,000 mg total) by mouth 2 (two) times daily. For 1 day, Disp: 20 tablet, Rfl: 1   Objective:     There were no vitals filed for this visit.    There is no height or weight on file to calculate BMI.    Physical Exam:    ***   Electronically signed by:  Marshall Skeeter D.[facility] 7:42 AM 05/17/23

## 2023-05-18 ENCOUNTER — Ambulatory Visit: Payer: Medicare (Managed Care) | Admitting: Sports Medicine

## 2023-05-18 VITALS — BP 126/76 | HR 76 | Ht 62.0 in | Wt 150.0 lb

## 2023-05-18 DIAGNOSIS — M25562 Pain in left knee: Secondary | ICD-10-CM

## 2023-05-18 DIAGNOSIS — M1712 Unilateral primary osteoarthritis, left knee: Secondary | ICD-10-CM

## 2023-05-18 DIAGNOSIS — G8929 Other chronic pain: Secondary | ICD-10-CM | POA: Diagnosis not present

## 2023-05-18 NOTE — Patient Instructions (Signed)
 Knee HEP Tylenol [phone] mg 2-3 times a day for pain relief  May use meloxicam  as needed for breakthrough pain limit 1-2 times per week  4 week follow up

## 2023-05-23 ENCOUNTER — Encounter: Payer: Self-pay | Admitting: Family Medicine

## 2023-05-23 ENCOUNTER — Other Ambulatory Visit: Payer: Self-pay | Admitting: Family Medicine

## 2023-05-24 ENCOUNTER — Telehealth: Payer: Self-pay | Admitting: Family Medicine

## 2023-05-24 MED ORDER — LOVASTATIN 10 MG PO TABS: 10.0000 mg | ORAL_TABLET | Freq: Every day | ORAL | 0 refills | Status: DC

## 2023-05-24 NOTE — Telephone Encounter (Signed)
 Please triage patient about her URI symptoms and let me know about her status.  Thanks.

## 2023-05-25 ENCOUNTER — Ambulatory Visit (INDEPENDENT_AMBULATORY_CARE_PROVIDER_SITE_OTHER): Payer: Medicare (Managed Care) | Admitting: Internal Medicine

## 2023-05-25 VITALS — BP 120/80 | HR 86 | Temp 98.4°F | Ht 62.0 in | Wt 149.0 lb

## 2023-05-25 DIAGNOSIS — J0100 Acute maxillary sinusitis, unspecified: Secondary | ICD-10-CM | POA: Insufficient documentation

## 2023-05-25 MED ORDER — DOXYCYCLINE HYCLATE 100 MG PO TABS
100.0000 mg | ORAL_TABLET | Freq: Two times a day (BID) | ORAL | 0 refills | Status: DC
Start: 2023-05-25 — End: 2023-09-19

## 2023-05-25 NOTE — Telephone Encounter (Signed)
 Sounds like sinus infection---I will assess her at the visit

## 2023-05-25 NOTE — Telephone Encounter (Signed)
 Noted. Thanks.

## 2023-05-25 NOTE — Progress Notes (Signed)
 Subjective:    Patient ID: [name], female    DOB: 12/21/1955, 68 y.o.   MRN: [id number]  HPI Here due to respiratory illness  Has had recurrent problems in past few springs Since about 10 days ago--head pressure, post nasal drip, feels bad Has tried tylenol sinus equivalent More allergies as child--not much as adult No fever Frontal headache last week Slight cough--when mucus stuck No sore throat--just dry No throat, nose or ear itching (though ears clogged some) No SOB  Current Outpatient Medications on File Prior to Visit  Medication Sig Dispense Refill   ALPRAZolam  (XANAX ) 0.5 MG tablet Take 1 tablet (0.5 mg total) by mouth at bedtime as needed. 30 tablet 2   Cholecalciferol (VITAMIN D3) 50 MCG (2000 UT) capsule Take 1 capsule (2,000 Units total) by mouth daily.     escitalopram (LEXAPRO) 20 MG tablet TAKE 1 TABLET BY MOUTH EVERY DAY 90 tablet 4   lovastatin  (MEVACOR ) 10 MG tablet Take 1 tablet (10 mg total) by mouth daily. 90 tablet 0   meloxicam  (MOBIC ) 15 MG tablet TAKE 1 TABLET BY MOUTH EVERY DAY 90 tablet 1   No current facility-administered medications on file prior to visit.    Allergies  Allergen Reactions   Pravastatin Sodium     Other reaction(s): leg cramps    Past Medical History:  Diagnosis Date   Anxiety    Hyperlipidemia     Past Surgical History:  Procedure Laterality Date   APPENDECTOMY     HEEL SPUR SURGERY Bilateral     Family History  Problem Relation Age of Onset   Stroke Mother    Pulmonary fibrosis Father    Colon cancer Neg Hx    Breast cancer Neg Hx     Social History   Socioeconomic History   Marital status: Divorced    Spouse name: Not on file   Number of children: Not on file   Years of education: Not on file   Highest education level: Some college, no degree  Occupational History   Not on file  Tobacco Use   Smoking status: Every Day    Types: Cigarettes   Smokeless tobacco: Never  Vaping Use   Vaping  status: Never Used  Substance and Sexual Activity   Alcohol use: Yes    Comment: occasional   Drug use: Never   Sexual activity: Not on file  Other Topics Concern   Not on file  Social History Narrative   [place] fan.     Divorced, lives alone.     Social Drivers of Corporate investment banker Strain: Low Risk  (05/25/2023)   Overall Financial Resource Strain (CARDIA)    Difficulty of Paying Living Expenses: Not very hard  Food Insecurity: No Food Insecurity (05/25/2023)   Hunger Vital Sign    Worried About Running Out of Food in the Last Year: Never true    Ran Out of Food in the Last Year: Never true  Transportation Needs: No Transportation Needs (05/25/2023)   PRAPARE - Administrator, Civil Service (Medical): No    Lack of Transportation (Non-Medical): No  Physical Activity: Insufficiently Active (05/25/2023)   Exercise Vital Sign    Days of Exercise per Week: 6 days    Minutes of Exercise per Session: 20 min  Stress: No Stress Concern Present (05/25/2023)   [employer] of Occupational Health - Occupational Stress Questionnaire    Feeling of Stress : Only a little  Social Connections: Moderately Isolated (05/25/2023)   Social Connection and Isolation Panel [NHANES]    Frequency of Communication with Friends and Family: More than three times a week    Frequency of Social Gatherings with Friends and Family: Three times a week    Attends Religious Services: 1 to 4 times per year    Active Member of Clubs or Organizations: No    Attends Engineer, structural: Not on file    Marital Status: Divorced  Catering manager Violence: Not on file   Review of Systems No loss of taste--smell is off now due to congestion No N/V Eating okay     Objective:   Physical Exam Constitutional:      Appearance: Normal appearance.  HENT:     Head:     Comments: Mild maxillary sensitivity    Right Ear: Tympanic membrane and ear canal normal.     Left Ear: Tympanic  membrane and ear canal normal.     Mouth/Throat:     Pharynx: No oropharyngeal exudate or posterior oropharyngeal erythema.  Pulmonary:     Effort: Pulmonary effort is normal.     Breath sounds: Normal breath sounds. No wheezing or rales.  Musculoskeletal:     Cervical back: Neck supple.  Lymphadenopathy:     Cervical: No cervical adenopathy.  Neurological:     Mental Status: She is alert.            Assessment & Plan:

## 2023-05-25 NOTE — Assessment & Plan Note (Signed)
 May have come on after grass pollen sensitivity Recommended nightly cetirizine for the next few weeks Will give doxy 100 bid x 7 days

## 2023-05-25 NOTE — Telephone Encounter (Signed)
 I spoke with pt;  for about 1 wk has had low grade temp 99.8, rt side of face is puffy, H/A behind eyes and forehead, sinus drainage,, prod cough yellow thick phlegm; no dizziness, wheezing, CP or SOB. Pt is going  out of town for 2 wks on 05/26/23 and wonders about getting abx since OTC meds not working. Pt scheduled appt with Dr Joelle Musca 05/25/23 at 11:15 with UC & ED precautions. Pt voiced understandiong and appreciative of appt. Sending note to Dr Joelle Musca andFYI to Dr Vallarie Gauze

## 2023-05-29 ENCOUNTER — Other Ambulatory Visit: Payer: Self-pay | Admitting: Family Medicine

## 2023-05-30 DIAGNOSIS — F330 Major depressive disorder, recurrent, mild: Secondary | ICD-10-CM | POA: Diagnosis not present

## 2023-05-30 DIAGNOSIS — F900 Attention-deficit hyperactivity disorder, predominantly inattentive type: Secondary | ICD-10-CM | POA: Diagnosis not present

## 2023-05-30 DIAGNOSIS — F411 Generalized anxiety disorder: Secondary | ICD-10-CM | POA: Diagnosis not present

## 2023-06-12 NOTE — Telephone Encounter (Signed)
 Copied from CRM [id number]. Topic: Clinical - Medication Refill >> Jun 12, 2023  9:25 AM Emmet Harm C wrote: Medication: escitalopram (LEXAPRO) 20 MG tablet  Has the patient contacted their pharmacy? Yes (Agent: If no, request that the patient contact the pharmacy for the refill. If patient does not wish to contact the pharmacy document the reason why and proceed with request.) (Agent: If yes, when and what did the pharmacy advise?)  This is the patient's preferred pharmacy:  [id number] [place], [place] - [address] AT [facility] OF [address] [place] [address] Phone: [phone] Fax: [phone]    Is this the correct pharmacy for this prescription? Yes If no, delete pharmacy and type the correct one.   Has the prescription been filled recently? No  Is the patient out of the medication? Yes  Has the patient been seen for an appointment in the last year OR does the patient have an upcoming appointment? Yes  Can we respond through MyChart? No  Agent: Please be advised that Rx refills may take up to 3 business days. We ask that you follow-up with your pharmacy.

## 2023-06-13 MED ORDER — ESCITALOPRAM OXALATE 20 MG PO TABS: 20.0000 mg | ORAL_TABLET | Freq: Every day | ORAL | 4 refills | Status: DC

## 2023-06-13 NOTE — Telephone Encounter (Signed)
 [facility]  I [facility] to [facility].

## 2023-06-13 NOTE — Addendum Note (Signed)
 Addended by: Donnie Galea on: 06/13/2023 11:11 AM   Modules accepted: Orders

## 2023-06-14 NOTE — Progress Notes (Unsigned)
    [name] D.[name] [address] [place] [address] Phone: [phone]   Assessment and Plan:     There are no diagnoses linked to this encounter.  ***   Pertinent previous records reviewed include ***    Follow Up: ***     Subjective:   I, [name], am serving as a Neurosurgeon for Doctor [employer]   Chief Complaint: left knee pain    HPI:    06/23/2021 Patient's left knee pain does seem to have an acute on chronic possible meniscal tear noted with some mild displacement.  Meloxicam  encouraged for the next 10 days.  Discussed avoiding twisting motions.  Does have some arthritic changes noted of the knee as well.  Concern over some of the numbness that there could be some radicular symptoms that could be playing a role as well.  We will get further x-rays of the lumbar spine to further evaluate as well.  Patient hopefully will do well with conservative therapy and follow-up with me again in 6 to 8 weeks.  Worsening symptoms consider injection and formal physical therapy.   Updated 08/05/2021 [name] is a 68 y.o. female coming in with complaint of left knee pain. Twisting with foot planted still hurts but overall her pain has improved.    Xray IMPRESSION: Moderate medial compartment and mild patellofemoral compartment osteoarthritis.   05/18/2023 Patient is a 68 year old female with left knee pain. Patient states decreased ROM. has had knee flare for the last 6-8 months. Steps and incline hurt. But she has had some improvement. Ice and exercises aren't helping . Is going on a cruise end of the year.   06/15/2023 Patient states     Relevant Historical Information: None pertinent   Additional pertinent review of systems negative.   Current Outpatient Medications:    ALPRAZolam  (XANAX ) 0.5 MG tablet, Take 1 tablet (0.5 mg total) by mouth at bedtime as needed., Disp: 30 tablet, Rfl: 2   Cholecalciferol (VITAMIN D3) 50  MCG (2000 UT) capsule, Take 1 capsule (2,000 Units total) by mouth daily., Disp: , Rfl:    doxycycline  (VIBRA -TABS) 100 MG tablet, Take 1 tablet (100 mg total) by mouth 2 (two) times daily., Disp: 14 tablet, Rfl: 0   escitalopram (LEXAPRO) 20 MG tablet, Take 1 tablet (20 mg total) by mouth daily., Disp: 90 tablet, Rfl: 4   lovastatin  (MEVACOR ) 10 MG tablet, Take 1 tablet (10 mg total) by mouth daily., Disp: 90 tablet, Rfl: 0   meloxicam  (MOBIC ) 15 MG tablet, TAKE 1 TABLET BY MOUTH EVERY DAY, Disp: 90 tablet, Rfl: 1   Objective:     There were no vitals filed for this visit.    There is no height or weight on file to calculate BMI.    Physical Exam:    ***   Electronically signed by:  Marshall Skeeter D.[name] 7:43 AM 06/14/23

## 2023-06-15 ENCOUNTER — Ambulatory Visit: Payer: Medicare (Managed Care) | Admitting: Sports Medicine

## 2023-06-15 VITALS — BP 130/80 | HR 90 | Ht 62.0 in | Wt 148.0 lb

## 2023-06-15 DIAGNOSIS — M1712 Unilateral primary osteoarthritis, left knee: Secondary | ICD-10-CM | POA: Diagnosis not present

## 2023-06-15 DIAGNOSIS — M25562 Pain in left knee: Secondary | ICD-10-CM | POA: Diagnosis not present

## 2023-06-15 DIAGNOSIS — G8929 Other chronic pain: Secondary | ICD-10-CM | POA: Diagnosis not present

## 2023-06-15 NOTE — Patient Instructions (Addendum)
 Left knee MRI Follow up when you return

## 2023-06-24 ENCOUNTER — Ambulatory Visit: Payer: Medicare (Managed Care)

## 2023-06-24 DIAGNOSIS — M1712 Unilateral primary osteoarthritis, left knee: Secondary | ICD-10-CM | POA: Diagnosis not present

## 2023-06-24 DIAGNOSIS — G8929 Other chronic pain: Secondary | ICD-10-CM

## 2023-06-26 ENCOUNTER — Ambulatory Visit: Payer: Self-pay

## 2023-06-29 NOTE — Progress Notes (Unsigned)
    [name] D.[facility] [address] [place] [address] Phone: [phone]   Assessment and Plan:     There are no diagnoses linked to this encounter.  ***   Pertinent previous records reviewed include ***    Follow Up: ***     Subjective:   I, [name], am serving as a Neurosurgeon for Doctor [employer]   Chief Complaint: left knee pain    HPI:    06/23/2021 Patient's left knee pain does seem to have an acute on chronic possible meniscal tear noted with some mild displacement.  Meloxicam  encouraged for the next 10 days.  Discussed avoiding twisting motions.  Does have some arthritic changes noted of the knee as well.  Concern over some of the numbness that there could be some radicular symptoms that could be playing a role as well.  We will get further x-rays of the lumbar spine to further evaluate as well.  Patient hopefully will do well with conservative therapy and follow-up with me again in 6 to 8 weeks.  Worsening symptoms consider injection and formal physical therapy.   Updated 08/05/2021 [name] is a 68 y.o. female coming in with complaint of left knee pain. Twisting with foot planted still hurts but overall her pain has improved.    Xray IMPRESSION: Moderate medial compartment and mild patellofemoral compartment osteoarthritis.   05/18/2023 Patient is a 68 year old female with left knee pain. Patient states decreased ROM. has had knee flare for the last 6-8 months. Steps and incline hurt. But she has had some improvement. Ice and exercises aren't helping . Is going on a cruise end of the year.    06/15/2023  Patient states pain is starting to come back    06/30/2023 Patient states   Relevant Historical Information: None pertinent    Additional pertinent review of systems negative.   Current Outpatient Medications:    ALPRAZolam  (XANAX ) 0.5 MG tablet, Take 1 tablet (0.5 mg total) by mouth at bedtime as  needed., Disp: 30 tablet, Rfl: 2   Cholecalciferol (VITAMIN D3) 50 MCG (2000 UT) capsule, Take 1 capsule (2,000 Units total) by mouth daily., Disp: , Rfl:    doxycycline  (VIBRA -TABS) 100 MG tablet, Take 1 tablet (100 mg total) by mouth 2 (two) times daily., Disp: 14 tablet, Rfl: 0   escitalopram  (LEXAPRO ) 20 MG tablet, Take 1 tablet (20 mg total) by mouth daily., Disp: 90 tablet, Rfl: 4   lovastatin  (MEVACOR ) 10 MG tablet, Take 1 tablet (10 mg total) by mouth daily., Disp: 90 tablet, Rfl: 0   meloxicam  (MOBIC ) 15 MG tablet, TAKE 1 TABLET BY MOUTH EVERY DAY, Disp: 90 tablet, Rfl: 1   Objective:     There were no vitals filed for this visit.    There is no height or weight on file to calculate BMI.    Physical Exam:    ***   Electronically signed by:  [name] D.[facility] 7:38 AM 06/29/23

## 2023-06-30 ENCOUNTER — Ambulatory Visit: Payer: Medicare (Managed Care) | Admitting: Sports Medicine

## 2023-06-30 ENCOUNTER — Telehealth: Payer: Self-pay | Admitting: Sports Medicine

## 2023-06-30 DIAGNOSIS — G8929 Other chronic pain: Secondary | ICD-10-CM | POA: Diagnosis not present

## 2023-06-30 DIAGNOSIS — M25562 Pain in left knee: Secondary | ICD-10-CM | POA: Diagnosis not present

## 2023-06-30 DIAGNOSIS — M1712 Unilateral primary osteoarthritis, left knee: Secondary | ICD-10-CM

## 2023-06-30 NOTE — Telephone Encounter (Signed)
 Patient ran for Monovisc for left knee on 06/30/23. Case  #: [id number]. Pending approval.

## 2023-06-30 NOTE — Telephone Encounter (Signed)
 Sent and closing L knee HA

## 2023-07-04 NOTE — Telephone Encounter (Signed)
 Received a fax PA form to fill out and attach clinicals. Filling out and faxing today

## 2023-07-07 NOTE — Telephone Encounter (Signed)
 Scheduled 07/20/23  Monovisc authorized for left knee Deductible des not apply OOP MAX $4000 has met $250.72 Once OOP has been met patient coverage goes to 100% PA APPROVED AUTH # [id number] 07/04/23-12/31/23

## 2023-07-07 NOTE — Telephone Encounter (Signed)
 Noted

## 2023-07-19 DIAGNOSIS — D2271 Melanocytic nevi of right lower limb, including hip: Secondary | ICD-10-CM | POA: Diagnosis not present

## 2023-07-19 DIAGNOSIS — D2272 Melanocytic nevi of left lower limb, including hip: Secondary | ICD-10-CM | POA: Diagnosis not present

## 2023-07-19 DIAGNOSIS — D225 Melanocytic nevi of trunk: Secondary | ICD-10-CM | POA: Diagnosis not present

## 2023-07-19 DIAGNOSIS — L570 Actinic keratosis: Secondary | ICD-10-CM | POA: Diagnosis not present

## 2023-07-19 DIAGNOSIS — D2262 Melanocytic nevi of left upper limb, including shoulder: Secondary | ICD-10-CM | POA: Diagnosis not present

## 2023-07-19 DIAGNOSIS — D2261 Melanocytic nevi of right upper limb, including shoulder: Secondary | ICD-10-CM | POA: Diagnosis not present

## 2023-07-19 DIAGNOSIS — L814 Other melanin hyperpigmentation: Secondary | ICD-10-CM | POA: Diagnosis not present

## 2023-07-19 NOTE — Progress Notes (Unsigned)
 [name] [address] [place] [address] Phone: [phone]   Assessment and Plan:    1. Primary osteoarthritis of left knee 2. Chronic pain of left knee  -Chronic with exacerbation, subsequent visit - Continued left knee pain consistent with flare of moderate to severe tricompartmental osteoarthritis - Patient has had only mild relief with CSI in the past with most recent on 05/18/2023.  Elected for HA injection today.  Tolerated well per note below - Use Tylenol 500 to 1000 mg tablets 2-3 times a day for day-to-day pain relief - Use meloxicam  15 mg daily as needed for pain.  Recommend limiting chronic NSAIDs to 1-2 doses per week to prevent long-term side effects.  Procedure: Ultrasound Guided Knee Joint Injection/Aspiration Side: Left Diagnosis: Flare of osteoarthritis US  Indication:  - accuracy is paramount for diagnosis - to ensure therapeutic efficacy or procedural success - to reduce procedural risk  Risks explained and consent was given verbally. The site was cleaned with Chlorhexidine. The suprapatellar pouch of the knee was identified.  A superficial wheal and numbing track was made using 25-gauge needle and 1 mL 1% lidocaine.  An 18-gauge needle was introduced to the pouch under ultrasound guidance.  10 mL of  clear/straw colored synovial fluid was aspirated. Syringe was exchanged and injection given using Monovisc 88 mg. This was well tolerated and resulted in symptomatic relief.  Needle was removed, hemostasis achieved, and post injection instructions were explained.   Pt was advised to call or return to clinic if these symptoms worsen or fail to improve as anticipated. Images permanently stored.   Pertinent previous records reviewed include none  Follow Up: 4 to 8 weeks for reevaluation.  If no improvement or worsening of symptoms, could discuss Zilretta  versus GAE procedure versus orthopedic surgery referral    Subjective:   I, [name], am serving as a Neurosurgeon for Doctor [employer]  Chief Complaint: left knee pain    HPI:    06/23/2021 Patient's left knee pain does seem to have an acute on chronic possible meniscal tear noted with some mild displacement.  Meloxicam  encouraged for the next 10 days.  Discussed avoiding twisting motions.  Does have some arthritic changes noted of the knee as well.  Concern over some of the numbness that there could be some radicular symptoms that could be playing a role as well.  We will get further x-rays of the lumbar spine to further evaluate as well.  Patient hopefully will do well with conservative therapy and follow-up with me again in 6 to 8 weeks.  Worsening symptoms consider injection and formal physical therapy.   Updated 08/05/2021 [name] is a 68 y.o. female coming in with complaint of left knee pain. Twisting with foot planted still hurts but overall her pain has improved.    Xray IMPRESSION: Moderate medial compartment and mild patellofemoral compartment osteoarthritis.   05/18/2023 Patient is a 68 year old female with left knee pain. Patient states decreased ROM. has had knee flare for the last 6-8 months. Steps and incline hurt. But she has had some improvement. Ice and exercises aren't helping . Is going on a cruise end of the year.    06/15/2023  Patient states pain is starting to come back    06/30/2023 Patient states continued knee pain that limits day-to-day activities.  Continued swelling decreasing knee range of motion.  Using Tylenol and intermittent meloxicam  with mild relief of symptoms.  07/20/2023 Patient states is ready for gel    Relevant Historical Information: None pertinent  Additional pertinent review of systems negative.   Current Outpatient Medications:    ALPRAZolam  (XANAX ) 0.5 MG tablet, Take 1 tablet (0.5 mg total) by mouth at bedtime as needed., Disp: 30 tablet, Rfl: 2   Cholecalciferol (VITAMIN D3) 50 MCG  (2000 UT) capsule, Take 1 capsule (2,000 Units total) by mouth daily., Disp: , Rfl:    doxycycline  (VIBRA -TABS) 100 MG tablet, Take 1 tablet (100 mg total) by mouth 2 (two) times daily., Disp: 14 tablet, Rfl: 0   escitalopram  (LEXAPRO ) 20 MG tablet, Take 1 tablet (20 mg total) by mouth daily., Disp: 90 tablet, Rfl: 4   lovastatin  (MEVACOR ) 10 MG tablet, Take 1 tablet (10 mg total) by mouth daily., Disp: 90 tablet, Rfl: 0   meloxicam  (MOBIC ) 15 MG tablet, TAKE 1 TABLET BY MOUTH EVERY DAY, Disp: 90 tablet, Rfl: 1   Objective:     Vitals:   07/20/23 1125  Pulse: 76  SpO2: 97%  Weight: 142 lb (64.4 kg)  Height: 5' 2 (1.575 m)      Body mass index is 25.97 kg/m.    Physical Exam:    General:  awake, alert oriented, no acute distress nontoxic Skin: no suspicious lesions or rashes Neuro:sensation intact and strength 5/5 with no deficits, no atrophy, normal muscle tone Psych: No signs of anxiety, depression or other mood disorder   Left knee: Mild to moderate swelling No deformity Positive fluid wave, joint milking ROM Flex 100, Ext 5 TTP medial joint line, medial femoral condyle NTTP over the quad tendon,   lat fem condyle, patella, plica, patella tendon, tibial tuberostiy, fibular head, posterior fossa, pes anserine bursa, gerdy's tubercle, , lateral jt line Positive anterior and posterior drawer Positive lachman Neg sag sign Negative varus stress Positive valgus stress for medial laxity and pain  Gait normal       Electronically signed by:  Odis Mace D.[name] 11:42 AM 07/20/23

## 2023-07-20 ENCOUNTER — Other Ambulatory Visit: Payer: Self-pay

## 2023-07-20 ENCOUNTER — Ambulatory Visit: Payer: Medicare (Managed Care) | Admitting: Sports Medicine

## 2023-07-20 VITALS — HR 76 | Ht 62.0 in | Wt 142.0 lb

## 2023-07-20 DIAGNOSIS — M1712 Unilateral primary osteoarthritis, left knee: Secondary | ICD-10-CM

## 2023-07-20 DIAGNOSIS — M25562 Pain in left knee: Secondary | ICD-10-CM

## 2023-07-20 DIAGNOSIS — G8929 Other chronic pain: Secondary | ICD-10-CM

## 2023-07-20 MED ORDER — HYALURONAN 88 MG/4ML IX SOSY
88.0000 mg | PREFILLED_SYRINGE | Freq: Once | INTRA_ARTICULAR | Status: AC
  Administered 2023-07-20: 88 mg via INTRA_ARTICULAR

## 2023-07-20 NOTE — Patient Instructions (Signed)
 Tylenol [phone] mg 2-3 times a day for pain relief  - Use meloxicam  15 mg daily as needed for pain.  Recommend limiting chronic NSAIDs to 1-2 doses per week to prevent long-term side effects. Water aerobics, stationary bike, elliptical  1-2 month follow up

## 2023-07-21 DIAGNOSIS — H524 Presbyopia: Secondary | ICD-10-CM | POA: Diagnosis not present

## 2023-08-18 NOTE — Progress Notes (Signed)
 [name] D.[name] [address] [place] [address] Phone: [phone]   Assessment and Plan:     1. Primary osteoarthritis of left knee 2. Chronic pain of left knee -Chronic with exacerbation, subsequent visit - Overall improvement in flare of left knee pain consistent with improving flare of moderate to severe tricompartmental osteoarthritis.  However, patient continues to experience day-to-day pain after HA injection performed on 07/20/2023.  Patient only received mild relief with CSI performed on 05/18/2023 - Patient elected to proceed with Zilretta  injection.  Will obtain prior authorization and have patient follow-up for injection - Use Tylenol 500 to 1000 mg tablets 2-3 times a day for day-to-day pain relief - Use meloxicam  15 mg daily as needed for pain.  Recommend limiting chronic NSAIDs to 1-2 doses per week to prevent long-term side effects. - Start physical therapy.  Referral sent - Briefly discussed alternative treatment options such as PRP injection, GAE procedure, orthopedic surgery referral   Pertinent previous records reviewed include none  Follow Up: Will contact patient once Zilretta  is approved to follow-up in clinic for procedure only visit   Subjective:   I, [name], am serving as a Neurosurgeon for Doctor Morene Becker   Chief Complaint: left knee pain    HPI:    06/23/2021 Patient's left knee pain does seem to have an acute on chronic possible meniscal tear noted with some mild displacement.  Meloxicam  encouraged for the next 10 days.  Discussed avoiding twisting motions.  Does have some arthritic changes noted of the knee as well.  Concern over some of the numbness that there could be some radicular symptoms that could be playing a role as well.  We will get further x-rays of the lumbar spine to further evaluate as well.  Patient hopefully will do well with conservative therapy and follow-up with me again in 6  to 8 weeks.  Worsening symptoms consider injection and formal physical therapy.   Updated 08/05/2021 [name] is a 68 y.o. female coming in with complaint of left knee pain. Twisting with foot planted still hurts but overall her pain has improved.    Xray IMPRESSION: Moderate medial compartment and mild patellofemoral compartment osteoarthritis.   05/18/2023 Patient is a 68 year old female with left knee pain. Patient states decreased ROM. has had knee flare for the last 6-8 months. Steps and incline hurt. But she has had some improvement. Ice and exercises aren't helping . Is going on a cruise end of the year.    06/15/2023  Patient states pain is starting to come back    06/30/2023 Patient states continued knee pain that limits day-to-day activities.  Continued swelling decreasing knee range of motion.  Using Tylenol and intermittent meloxicam  with mild relief of symptoms.   07/20/2023 Patient states is ready for gel   08/21/2023 Patient states she is feeling better but not great. Pain is less intense, she isnt able to do all of the things she would like to   Relevant Historical Information: None pertinent  Additional pertinent review of systems negative.   Current Outpatient Medications:    ALPRAZolam  (XANAX ) 0.5 MG tablet, Take 1 tablet (0.5 mg total) by mouth at bedtime as needed., Disp: 30 tablet, Rfl: 2   Cholecalciferol (VITAMIN D3) 50 MCG (2000 UT) capsule, Take 1 capsule (2,000 Units total) by mouth daily., Disp: , Rfl:    doxycycline  (VIBRA -TABS) 100 MG tablet, Take 1 tablet (100 mg total) by  mouth 2 (two) times daily., Disp: 14 tablet, Rfl: 0   escitalopram  (LEXAPRO ) 20 MG tablet, Take 1 tablet (20 mg total) by mouth daily., Disp: 90 tablet, Rfl: 4   lovastatin  (MEVACOR ) 10 MG tablet, Take 1 tablet (10 mg total) by mouth daily., Disp: 90 tablet, Rfl: 0   meloxicam  (MOBIC ) 15 MG tablet, TAKE 1 TABLET BY MOUTH EVERY DAY, Disp: 90 tablet, Rfl: 1   Objective:     Vitals:    08/21/23 1356  BP: 122/80  Pulse: 73  Weight: 147 lb (66.7 kg)  Height: 5' 2 (1.575 m)      Body mass index is 26.89 kg/m.    Physical Exam:    General:  awake, alert oriented, no acute distress nontoxic Skin: no suspicious lesions or rashes Neuro:sensation intact and strength 5/5 with no deficits, no atrophy, normal muscle tone Psych: No signs of anxiety, depression or other mood disorder   Left knee: Mild to moderate swelling No deformity Positive fluid wave, joint milking ROM Flex 100, Ext 5 TTP medial joint line, medial femoral condyle NTTP over the quad tendon,   lat fem condyle, patella, plica, patella tendon, tibial tuberostiy, fibular head, posterior fossa, pes anserine bursa, gerdy's tubercle, , lateral jt line Positive anterior and posterior drawer Positive lachman Neg sag sign Negative varus stress Positive valgus stress for medial laxity and pain  Gait normal     Electronically signed by:  [name] D.[name] 2:34 PM 08/21/23

## 2023-08-21 ENCOUNTER — Ambulatory Visit: Payer: Medicare (Managed Care) | Admitting: Sports Medicine

## 2023-08-21 ENCOUNTER — Telehealth: Payer: Self-pay

## 2023-08-21 ENCOUNTER — Other Ambulatory Visit: Payer: Self-pay | Admitting: Family Medicine

## 2023-08-21 VITALS — BP 122/80 | HR 73 | Ht 62.0 in | Wt 147.0 lb

## 2023-08-21 DIAGNOSIS — M1712 Unilateral primary osteoarthritis, left knee: Secondary | ICD-10-CM

## 2023-08-21 DIAGNOSIS — M25562 Pain in left knee: Secondary | ICD-10-CM

## 2023-08-21 DIAGNOSIS — G8929 Other chronic pain: Secondary | ICD-10-CM

## 2023-08-21 NOTE — Telephone Encounter (Signed)
 Patient ran for Zilretta  for left knee on 08/21/23. Case #: [id number]. Pending approval.

## 2023-08-21 NOTE — Patient Instructions (Addendum)
 PT referral   Zilretta  L knee   Tylenol [phone] mg 2-3 times a day for pain relief   - Use meloxicam  15 mg daily as needed for pain.  Recommend limiting chronic NSAIDs to 1-2 doses per week to prevent long-term side effects.   We will contact you once zilretta  has been approved for a follow up

## 2023-08-21 NOTE — Telephone Encounter (Signed)
-----   Message from [facility] sent at 08/21/2023  2:19 PM EDT ----- L knee zilretta

## 2023-08-23 ENCOUNTER — Other Ambulatory Visit: Payer: Self-pay | Admitting: Family Medicine

## 2023-08-23 NOTE — Telephone Encounter (Signed)
 PA required. PA faxed. Pending approval.

## 2023-08-24 NOTE — Telephone Encounter (Signed)
 Patient needs an appointment once medication is stocked.  Zilretta  approved for left knee.  A prior authorization is required for this patient's plan. The office must contact [phone] to initiate. No medical notes or referrals needed. Patient has a Fully Funded Medicare advantage HMO plan with an effective date of 01/11/2023. Plan follows Medicare guidelines. Patient responsibility for [phone] (Zilretta ) and CPT code 79389 will be 20% with the remaining covered at 80% by the payer at the contracted rate. Deductibles do not apply to these services. Specialist office visits if billed are covered after a $20 copay. Patient has an out of pocket maximum of $4000 and has accumulated $539.21. If out of pocket is met, coverage goes to 100% and copay will no longer apply.  Fax received stating approval for Zilretta . We have approved coverage or payment for the following non-formulary prescription drug that you or your patient requested under his or her Medicare prescription drug plan: Zilretta  32 mg SUSER VIAL. We're happy to let you and your patient know that this reques has been approved from July 24, 2023 until August 22, 2024.  From Case: Case ID: [id number] Contract ID: [id number]  Zilretta  Case # [id number] Expiration: August 22, 2024

## 2023-08-24 NOTE — Telephone Encounter (Signed)
 LOV: 5/15/205  NOV: nothing scheduled Last Refill: ALPRAZolam  (XANAX ) 0.5 MG tablet 04/02/2023 30 tablets 2 refills

## 2023-08-28 ENCOUNTER — Other Ambulatory Visit: Payer: Self-pay | Admitting: Family Medicine

## 2023-08-28 NOTE — Telephone Encounter (Signed)
 Copied from CRM #[id number]. Topic: Clinical - Medication Refill >> Aug 28, 2023  9:43 AM Pinkey ORN wrote: Medication: ALPRAZolam  (XANAX ) 0.5 MG tablet  Has the patient contacted their pharmacy? Yes (Agent: If no, request that the patient contact the pharmacy for the refill. If patient does not wish to contact the pharmacy document the reason why and proceed with request.) (Agent: If yes, when and what did the pharmacy advise?)  This is the patient's preferred pharmacy:  [id number] [place], [place] - [address] AT [facility] OF [address] [place] [address] Phone: [phone] Fax: [phone]   Is this the correct pharmacy for this prescription? Yes If no, delete pharmacy and type the correct one.   Has the prescription been filled recently? No  Is the patient out of the medication? Yes  Has the patient been seen for an appointment in the last year OR does the patient have an upcoming appointment? Yes  Can we respond through MyChart? Yes  Agent: Please be advised that Rx refills may take up to 3 business days. We ask that you follow-up with your pharmacy. >> Aug 28, 2023  9:44 AM Pinkey ORN wrote: Patient states she used her last one Saturday night.

## 2023-08-29 NOTE — Progress Notes (Unsigned)
    [name] D.[facility] [address] [place] [address] Phone: [phone]   Assessment and Plan:     There are no diagnoses linked to this encounter.  ***   Pertinent previous records reviewed include ***    Follow Up: ***     Subjective:    Chief Complaint: ***  HPI:  06/23/2021 Patient's left knee pain does seem to have an acute on chronic possible meniscal tear noted with some mild displacement.  Meloxicam  encouraged for the next 10 days.  Discussed avoiding twisting motions.  Does have some arthritic changes noted of the knee as well.  Concern over some of the numbness that there could be some radicular symptoms that could be playing a role as well.  We will get further x-rays of the lumbar spine to further evaluate as well.  Patient hopefully will do well with conservative therapy and follow-up with me again in 6 to 8 weeks.  Worsening symptoms consider injection and formal physical therapy.   Updated 08/05/2021 [name] is a 68 y.o. female coming in with complaint of left knee pain. Twisting with foot planted still hurts but overall her pain has improved.    Xray IMPRESSION: Moderate medial compartment and mild patellofemoral compartment osteoarthritis.   05/18/2023 Patient is a 68 year old female with left knee pain. Patient states decreased ROM. has had knee flare for the last 6-8 months. Steps and incline hurt. But she has had some improvement. Ice and exercises aren't helping . Is going on a cruise end of the year.    06/15/2023  Patient states pain is starting to come back    06/30/2023 Patient states continued knee pain that limits day-to-day activities.  Continued swelling decreasing knee range of motion.  Using Tylenol and intermittent meloxicam  with mild relief of symptoms.   07/20/2023 Patient states is ready for gel    08/21/2023 Patient states she is feeling better but not great. Pain is less intense, she isnt  able to do all of the things she would like to  08/30/23 Patient states  Relevant Historical Information: ***  Additional pertinent review of systems negative.   Current Outpatient Medications:    ALPRAZolam  (XANAX ) 0.5 MG tablet, TAKE 1 TABLET(0.5 MG) BY MOUTH AT BEDTIME AS NEEDED, Disp: 30 tablet, Rfl: 2   Cholecalciferol (VITAMIN D3) 50 MCG (2000 UT) capsule, Take 1 capsule (2,000 Units total) by mouth daily., Disp: , Rfl:    doxycycline  (VIBRA -TABS) 100 MG tablet, Take 1 tablet (100 mg total) by mouth 2 (two) times daily., Disp: 14 tablet, Rfl: 0   escitalopram  (LEXAPRO ) 20 MG tablet, Take 1 tablet (20 mg total) by mouth daily., Disp: 90 tablet, Rfl: 4   lovastatin  (MEVACOR ) 10 MG tablet, TAKE 1 TABLET(10 MG) BY MOUTH DAILY, Disp: 90 tablet, Rfl: 0   meloxicam  (MOBIC ) 15 MG tablet, TAKE 1 TABLET BY MOUTH EVERY DAY, Disp: 90 tablet, Rfl: 1   Objective:     There were no vitals filed for this visit.    There is no height or weight on file to calculate BMI.    Physical Exam:    ***   Electronically signed by:  Odis Mace D.[facility] 3:39 PM 08/29/23

## 2023-08-29 NOTE — Telephone Encounter (Signed)
Scheduled 8/20

## 2023-08-30 ENCOUNTER — Ambulatory Visit: Payer: Medicare (Managed Care) | Admitting: Sports Medicine

## 2023-08-30 VITALS — Ht 62.0 in

## 2023-08-30 DIAGNOSIS — M1712 Unilateral primary osteoarthritis, left knee: Secondary | ICD-10-CM

## 2023-08-30 DIAGNOSIS — G8929 Other chronic pain: Secondary | ICD-10-CM | POA: Diagnosis not present

## 2023-08-30 DIAGNOSIS — M25562 Pain in left knee: Secondary | ICD-10-CM

## 2023-08-30 MED ORDER — TRIAMCINOLONE ACETONIDE 32 MG IX SRER
32.0000 mg | Freq: Once | INTRA_ARTICULAR | Status: AC
Start: 2023-08-30 — End: 2023-08-30
  Administered 2023-08-30: 32 mg via INTRA_ARTICULAR

## 2023-08-30 NOTE — Patient Instructions (Addendum)
 Thank you for coming in today.  Follow-up as needed.  If you received >3 months relief from today's Zilretta  injection, and pain returns, call and ask for a follow-up visit and repeat Zilretta  injection.  If you receive <3 months relief from today's steroid injection, call and follow-up in clinic to discuss next steps and treatment options  Start physical therapy.  You may contact their office at: [phone]

## 2023-09-18 ENCOUNTER — Ambulatory Visit: Payer: Self-pay

## 2023-09-18 NOTE — Telephone Encounter (Signed)
 FYI Only or Action Required?: FYI only for provider.  Patient was last seen in primary care on 05/25/2023 by Monica Charlie FERNS, MD.  Called Nurse Triage reporting Sinusitis.  Symptoms began yesterday.  Interventions attempted: Nothing.  Symptoms are: unchanged.  Triage Disposition: No disposition on file.  Patient/caregiver understands and will follow disposition?:       Copied from CRM [id number]. Topic: Clinical - Red Word Triage >> Sep 18, 2023  8:01 AM Donna BRAVO wrote: Red Word that prompted transfer to Nurse Triage: patient has sinus/head pressure, pressure behind both eyes and puffy left worse, sinus drainage, cough in mornings, headache off and on, low grade fever, Saturday fever 100.8 Sometime hear hart beat in ears, patient thinks this may have to doe with Zilretta  injection patient sounds overwhelmed, and has been crying

## 2023-09-19 ENCOUNTER — Ambulatory Visit (INDEPENDENT_AMBULATORY_CARE_PROVIDER_SITE_OTHER): Payer: Medicare (Managed Care) | Admitting: Family Medicine

## 2023-09-19 ENCOUNTER — Encounter: Payer: Self-pay | Admitting: Family Medicine

## 2023-09-19 VITALS — BP 124/78 | HR 87 | Temp 99.0°F | Ht 62.0 in | Wt 145.6 lb

## 2023-09-19 DIAGNOSIS — J0100 Acute maxillary sinusitis, unspecified: Secondary | ICD-10-CM

## 2023-09-19 DIAGNOSIS — M2550 Pain in unspecified joint: Secondary | ICD-10-CM | POA: Diagnosis not present

## 2023-09-19 MED ORDER — DOXYCYCLINE HYCLATE 100 MG PO TABS: 100.0000 mg | ORAL_TABLET | Freq: Two times a day (BID) | ORAL | 0 refills | Status: DC

## 2023-09-19 MED ORDER — ESCITALOPRAM OXALATE 20 MG PO TABS: 20.0000 mg | ORAL_TABLET | Freq: Every day | ORAL | 4 refills | Status: AC

## 2023-09-19 MED ORDER — MELOXICAM 15 MG PO TABS: 15.0000 mg | ORAL_TABLET | ORAL | Status: DC | PRN

## 2023-09-19 NOTE — Patient Instructions (Signed)
 I would start taking doxycycline .   Rest and fluids.  Take care.  Glad to see you. Tylenol if needed.   Update us  as needed.

## 2023-09-19 NOTE — Progress Notes (Unsigned)
 Sinus pressure.  Going on for about 2 weeks.  Initially R, then L frontal area, now both.  Now with maxillary area B.  Had temp 100.8 recently.  Taking tylenol in the meantime.  No cough but some throat clearing in the AM.  No ear pain.  Some ear pressure.  No ST.  No wheeze.   She had mood changes with zilretta  injection.  D/w pt. it is clearly possible to have mood changes after corticosteroid use.  I asked her to update me as needed.  D/w pt about meloxicam  prn but trying to limit use.  Prev Cr wnl, no h/o GIB.  She hasn't used in the last few weeks.  Nasaid/GI cautions d/w pt.   Meds, vitals, and allergies reviewed.   ROS: Per HPI unless specifically indicated in ROS section   Nad ncat TM wnl B Nasal exam stuffy.  OP with mild posterior irritation.   L max sinus ttp . Neck supple, no LA Rrr Ctab Abd soft Skin well perfused w/o BLE edema.

## 2023-09-20 NOTE — Telephone Encounter (Signed)
 See OV  note

## 2023-09-20 NOTE — Assessment & Plan Note (Signed)
 She had mood changes with zilretta  injection.  D/w pt. it is clearly possible to have mood changes after corticosteroid use.  I asked her to update me as needed.  D/w pt about meloxicam  prn but trying to limit use.  Prev Cr wnl, no h/o GIB.  She hasn't used in the last few weeks.  Nasaid/GI cautions d/w pt.

## 2023-09-20 NOTE — Assessment & Plan Note (Signed)
 Options discussed.  Okay for outpatient follow-up.  I would start taking doxycycline .   Rest and fluids.  Tylenol if needed.   Update us  as needed.

## 2023-09-21 ENCOUNTER — Ambulatory Visit (INDEPENDENT_AMBULATORY_CARE_PROVIDER_SITE_OTHER): Payer: Medicare (Managed Care)

## 2023-09-21 VITALS — BP 124/78 | Ht 62.0 in | Wt 145.0 lb

## 2023-09-21 DIAGNOSIS — Z0000 Encounter for general adult medical examination without abnormal findings: Secondary | ICD-10-CM | POA: Diagnosis not present

## 2023-09-21 NOTE — Patient Instructions (Signed)
 [name],  Thank you for taking the time for your Medicare Wellness Visit. I appreciate your continued commitment to your health goals. Please review the care plan we discussed, and feel free to reach out if I can assist you further.  Medicare recommends these wellness visits once per year to help you and your care team stay ahead of potential health issues. These visits are designed to focus on prevention, allowing your provider to concentrate on managing your acute and chronic conditions during your regular appointments.  Please note that Annual Wellness Visits do not include a physical exam. Some assessments may be limited, especially if the visit was conducted virtually. If needed, we may recommend a separate in-person follow-up with your provider.  Ongoing Care Seeing your primary care provider every 3 to 6 months helps us  monitor your health and provide consistent, personalized care.   Referrals If a referral was made during today's visit and you haven't received any updates within two weeks, please contact the referred provider directly to check on the status.  Recommended Screenings:  Health Maintenance  Topic Date Due   DTaP/Tdap/Td vaccine (1 - Tdap) Never done   Pneumococcal Vaccine for age over 49 (1 of 2 - PCV) Never done   Cologuard (Stool DNA test)  Never done   Flu Shot  08/11/2023   Medicare Annual Wellness Visit  09/20/2024   Breast Cancer Screening  12/14/2024   DEXA scan (bone density measurement)  Completed   Hepatitis C Screening  Completed   Zoster (Shingles) Vaccine  Completed   HPV Vaccine  Aged Out   Meningitis B Vaccine  Aged Out   COVID-19 Vaccine  Discontinued       09/21/2023   10:27 AM  Advanced Directives  Does Patient Have a Medical Advance Directive? Yes  Type of Estate agent of [place];Living will  Does patient want to make changes to medical advance directive? No - Patient declined  Copy of Healthcare Power of Attorney  in Chart? No - copy requested   Advance Care Planning is important because it: Ensures you receive medical care that aligns with your values, goals, and preferences. Provides guidance to your family and loved ones, reducing the emotional burden of decision-making during critical moments.  Vision: Annual vision screenings are recommended for early detection of glaucoma, cataracts, and diabetic retinopathy. These exams can also reveal signs of chronic conditions such as diabetes and high blood pressure.  Dental: Annual dental screenings help detect early signs of oral cancer, gum disease, and other conditions linked to overall health, including heart disease and diabetes.  Please see the attached documents for additional preventive care recommendations.

## 2023-09-21 NOTE — Progress Notes (Signed)
 Because this visit was a virtual/telehealth visit,  certain criteria was not obtained, such a blood pressure, CBG if applicable, and timed get up and go. Any medications not marked as taking were not mentioned during the medication reconciliation part of the visit. Any vitals not documented were not able to be obtained due to this being a telehealth visit or patient was unable to self-report a recent blood pressure reading due to a lack of equipment at home via telehealth. Vitals that have been documented are verbally provided by the patient.  This visit was performed by a medical professional under my direct supervision. I was immediately available for consultation/collaboration. I have reviewed and agree with the Annual Wellness Visit documentation.  Subjective:   [name] is a 68 y.o. who presents for a Medicare Wellness preventive visit.  As a reminder, Annual Wellness Visits don't include a physical exam, and some assessments may be limited, especially if this visit is performed virtually. We may recommend an in-person follow-up visit with your provider if needed.  Visit Complete: Virtual I connected with  MAGIN BALBI on 09/21/23 by a video and audio enabled telemedicine application and verified that I am speaking with the correct person using two identifiers.  Patient Location: Home  Provider Location: Home Office  I discussed the limitations of evaluation and management by telemedicine. The patient expressed understanding and agreed to proceed.  Vital Signs: Because this visit was a virtual/telehealth visit, some criteria may be missing or patient reported. Any vitals not documented were not able to be obtained and vitals that have been documented are patient reported.   Persons Participating in Visit: Patient.  AWV Questionnaire: No: Patient Medicare AWV questionnaire was not completed prior to this visit.  Cardiac Risk Factors include: advanced age (>32men, >44  women);sedentary lifestyle     Objective:    Today's Vitals   09/21/23 1028  BP: 124/78  Weight: 145 lb (65.8 kg)  Height: 5' 2 (1.575 m)   Body mass index is 26.52 kg/m.     09/21/2023   10:27 AM  Advanced Directives  Does Patient Have a Medical Advance Directive? Yes  Type of Estate agent of [place];Living will  Does patient want to make changes to medical advance directive? No - Patient declined  Copy of Healthcare Power of Attorney in Chart? No - copy requested    Current Medications (verified) Outpatient Encounter Medications as of 09/21/2023  Medication Sig   ALPRAZolam  (XANAX ) 0.5 MG tablet TAKE 1 TABLET(0.5 MG) BY MOUTH AT BEDTIME AS NEEDED   Cholecalciferol (VITAMIN D3) 50 MCG (2000 UT) capsule Take 1 capsule (2,000 Units total) by mouth daily.   doxycycline  (VIBRA -TABS) 100 MG tablet Take 1 tablet (100 mg total) by mouth 2 (two) times daily.   escitalopram  (LEXAPRO ) 20 MG tablet Take 1 tablet (20 mg total) by mouth daily.   lovastatin  (MEVACOR ) 10 MG tablet TAKE 1 TABLET(10 MG) BY MOUTH DAILY   meloxicam  (MOBIC ) 15 MG tablet Take 1 tablet (15 mg total) by mouth as needed.   No facility-administered encounter medications on file as of 09/21/2023.    Allergies (verified) Pravastatin sodium   History: Past Medical History:  Diagnosis Date   Anxiety    Hyperlipidemia    Past Surgical History:  Procedure Laterality Date   APPENDECTOMY     HEEL SPUR SURGERY Bilateral    Family History  Problem Relation Age of Onset   Stroke Mother    Pulmonary fibrosis Father  Colon cancer Neg Hx    Breast cancer Neg Hx    Social History   Socioeconomic History   Marital status: Divorced    Spouse name: Not on file   Number of children: Not on file   Years of education: Not on file   Highest education level: Some college, no degree  Occupational History   Not on file  Tobacco Use   Smoking status: Every Day    Types: Cigarettes    Smokeless tobacco: Never  Vaping Use   Vaping status: Never Used  Substance and Sexual Activity   Alcohol use: Yes    Comment: occasional   Drug use: Never   Sexual activity: Not on file  Other Topics Concern   Not on file  Social History Narrative   [place] fan.     Divorced, lives alone.     Social Drivers of Corporate investment banker Strain: Low Risk  (09/21/2023)   Overall Financial Resource Strain (CARDIA)    Difficulty of Paying Living Expenses: Not very hard  Food Insecurity: No Food Insecurity (09/21/2023)   Hunger Vital Sign    Worried About Running Out of Food in the Last Year: Never true    Ran Out of Food in the Last Year: Never true  Transportation Needs: No Transportation Needs (09/21/2023)   PRAPARE - Administrator, Civil Service (Medical): No    Lack of Transportation (Non-Medical): No  Physical Activity: Insufficiently Active (09/21/2023)   Exercise Vital Sign    Days of Exercise per Week: 7 days    Minutes of Exercise per Session: 20 min  Stress: No Stress Concern Present (09/21/2023)   [employer] of Occupational Health - Occupational Stress Questionnaire    Feeling of Stress: Only a little  Recent Concern: Stress - Stress Concern Present (09/18/2023)   [employer] of Occupational Health - Occupational Stress Questionnaire    Feeling of Stress: Rather much  Social Connections: Socially Isolated (09/21/2023)   Social Connection and Isolation Panel    Frequency of Communication with Friends and Family: More than three times a week    Frequency of Social Gatherings with Friends and Family: More than three times a week    Attends Religious Services: Never    Database administrator or Organizations: No    Attends Engineer, structural: Never    Marital Status: Divorced    Tobacco Counseling Ready to quit: Not Answered Counseling given: Not Answered    Clinical Intake:  Pre-visit preparation completed: Yes  Pain :  No/denies pain     BMI - recorded: 26.52 Nutritional Status: BMI 25 -29 Overweight Nutritional Risks: None Diabetes: No  No results found for: HGBA1C   How often do you need to have someone help you when you read instructions, pamphlets, or other written materials from your doctor or pharmacy?: 1 - Never  Interpreter Needed?: No  Information entered by :: Princeston Blizzard,CMA   Activities of Daily Living     09/21/2023   10:30 AM  In your present state of health, do you have any difficulty performing the following activities:  Hearing? 0  Vision? 0  Difficulty concentrating or making decisions? 0  Walking or climbing stairs? 0  Dressing or bathing? 0  Doing errands, shopping? 0  Preparing Food and eating ? N  Using the Toilet? N  In the past six months, have you accidently leaked urine? N  Do you have problems with loss  of bowel control? N  Managing your Medications? N  Managing your Finances? N  Housekeeping or managing your Housekeeping? N    Patient Care Team: Cleatus Arlyss RAMAN, MD as PCP - General (Family Medicine)  I have updated your Care Teams any recent Medical Services you may have received from other providers in the past year.     Assessment:   This is a routine wellness examination for Cicilia.  Hearing/Vision screen Hearing Screening - Comments:: No difficulties  Vision Screening - Comments:: Patient wears glasses    Goals Addressed             This Visit's Progress    Patient Stated       Travel some more        Depression Screen     09/21/2023   10:32 AM 09/19/2023    9:06 AM 05/25/2023   11:13 AM 09/19/2022    3:22 PM 06/09/2022    2:05 PM 05/27/2022   10:08 AM  PHQ 2/9 Scores  PHQ - 2 Score 5 4 0 0 2 2  PHQ- 9 Score 5 11  4 4 4     Fall Risk     09/21/2023   10:29 AM 09/19/2023    9:06 AM 05/25/2023   11:13 AM 09/19/2022    3:22 PM 06/09/2022    2:04 PM  Fall Risk   Falls in the past year? 1 1 1 1 1   Number falls in past yr: 0 0 0  0 0  Injury with Fall? 0 0 0 0 0  Risk for fall due to : History of fall(s);Impaired balance/gait;Orthopedic patient History of fall(s) No Fall Risks No Fall Risks No Fall Risks  Follow up Falls evaluation completed;Education provided Falls evaluation completed Falls evaluation completed Falls evaluation completed Falls evaluation completed    MEDICARE RISK AT HOME:  Medicare Risk at Home Any stairs in or around the home?: No If so, are there any without handrails?: No Home free of loose throw rugs in walkways, pet beds, electrical cords, etc?: Yes Adequate lighting in your home to reduce risk of falls?: Yes Life alert?: No Use of a cane, walker or w/c?: No Grab bars in the bathroom?: No Shower chair or bench in shower?: No Elevated toilet seat or a handicapped toilet?: No  TIMED UP AND GO:  Was the test performed?  No  Cognitive Function: 6CIT completed        09/21/2023   10:33 AM  6CIT Screen  What Year? 0 points  What month? 0 points  What time? 0 points  Count back from 20 0 points  Months in reverse 0 points  Repeat phrase 0 points  Total Score 0 points    Immunizations Immunization History  Administered Date(s) Administered   Fluad Trivalent(High Dose 65+) 10/04/2022   Influenza,inj,Quad PF,6+ Mos 11/01/2021   PFIZER(Purple Top)SARS-COV-2 Vaccination 03/26/2019, 04/23/2019, 12/21/2019   Zoster Recombinant(Shingrix) 11/01/2021, 03/08/2022    Screening Tests Health Maintenance  Topic Date Due   DTaP/Tdap/Td (1 - Tdap) Never done   Pneumococcal Vaccine: 50+ Years (1 of 2 - PCV) Never done   Fecal DNA (Cologuard)  Never done   Influenza Vaccine  08/11/2023   Medicare Annual Wellness (AWV)  09/20/2024   Mammogram  12/14/2024   DEXA SCAN  Completed   Hepatitis C Screening  Completed   Zoster Vaccines- Shingrix  Completed   HPV VACCINES  Aged Out   Meningococcal B Vaccine  Aged Out  COVID-19 Vaccine  Discontinued    Health Maintenance Items  Addressed:patient declined   Additional Screening:  Vision Screening: Recommended annual ophthalmology exams for early detection of glaucoma and other disorders of the eye. Is the patient up to date with their annual eye exam?  No  Who is the provider or what is the name of the office in which the patient attends annual eye exams?   Dental Screening: Recommended annual dental exams for proper oral hygiene  Community Resource Referral / Chronic Care Management: CRR required this visit?  No   CCM required this visit?  No   Plan:    I have personally reviewed and noted the following in the patient's chart:   Medical and social history Use of alcohol, tobacco or illicit drugs  Current medications and supplements including opioid prescriptions. Patient is not currently taking opioid prescriptions. Functional ability and status Nutritional status Physical activity Advanced directives List of other physicians Hospitalizations, surgeries, and ER visits in previous 12 months Vitals Screenings to include cognitive, depression, and falls Referrals and appointments  In addition, I have reviewed and discussed with patient certain preventive protocols, quality metrics, and best practice recommendations. A written personalized care plan for preventive services as well as general preventive health recommendations were provided to patient.   Lyle MARLA Right, [place]   09/21/2023   After Visit Summary: (MyChart) Due to this being a telephonic visit, the after visit summary with patients personalized plan was offered to patient via MyChart   Notes: Nothing significant to report at this time.

## 2023-09-22 ENCOUNTER — Ambulatory Visit: Payer: Self-pay

## 2023-09-22 ENCOUNTER — Ambulatory Visit: Payer: Self-pay | Admitting: General Practice

## 2023-09-22 ENCOUNTER — Ambulatory Visit (INDEPENDENT_AMBULATORY_CARE_PROVIDER_SITE_OTHER): Payer: Medicare (Managed Care) | Admitting: General Practice

## 2023-09-22 ENCOUNTER — Encounter: Payer: Self-pay | Admitting: General Practice

## 2023-09-22 VITALS — BP 120/82 | HR 82 | Temp 98.4°F | Ht 62.0 in | Wt 145.2 lb

## 2023-09-22 DIAGNOSIS — J0100 Acute maxillary sinusitis, unspecified: Secondary | ICD-10-CM | POA: Diagnosis not present

## 2023-09-22 DIAGNOSIS — R509 Fever, unspecified: Secondary | ICD-10-CM | POA: Diagnosis not present

## 2023-09-22 LAB — POCT INFLUENZA A/B
Influenza A, POC: NEGATIVE
Influenza B, POC: NEGATIVE

## 2023-09-22 LAB — POC COVID19 BINAXNOW: SARS Coronavirus 2 Ag: NEGATIVE

## 2023-09-22 MED ORDER — AMOXICILLIN-POT CLAVULANATE 875-125 MG PO TABS: 1.0000 | ORAL_TABLET | Freq: Two times a day (BID) | ORAL | 0 refills | Status: AC

## 2023-09-22 NOTE — Assessment & Plan Note (Signed)
 POC covid and flu negative.

## 2023-09-22 NOTE — Patient Instructions (Signed)
 Stop doxycycline .   Start Augmentin  antibiotics. Take 1 tablet by mouth twice daily for 7 days. Make sure you take it with food.  Nasal Congestion/Ear Pressure/Sinus Pressure: Try using Flonase (fluticasone) nasal spray. Instill 1 spray in each nostril twice daily.   Rest, increase fluid intake and use cool mist humidifier.   Please update if your symptoms worsen or do not improve.   It was a pleasure to meet you today!

## 2023-09-22 NOTE — Telephone Encounter (Signed)
 FYI Only or Action Required?: FYI only for provider.  Patient was last seen in primary care on 09/19/2023 by Cleatus Arlyss RAMAN, MD.  Called Nurse Triage reporting Sinusitis and Fever.  Symptoms began several weeks ago.  Interventions attempted: OTC medications: Tylenol and Prescription medications: doxycycline .  Symptoms are: unchanged.  Triage Disposition: See Physician Within 24 Hours  Patient/caregiver understands and will follow disposition?: Yes                             Copied from CRM #[id number]. Topic: Clinical - Prescription Issue >> Sep 22, 2023  8:55 AM Tiffini S wrote: Reason for CRM: Patient called about taking the prescription doxycycline  (VIBRA -TABS) 100 MG tablet  - said the medication is not helping very much- symptoms are: fever at  99.5 daily, eye pressure, headaches and  not eating Reason for Disposition  [1] Taking antibiotic > 48 hours (2 days) AND [2] fever persists  Answer Assessment - Initial Assessment Questions 1. ANTIBIOTIC: What antibiotic are you taking? How many times a day?     Doxycycline  100 MG bid 2. ONSET: When was the antibiotic started?     09/19/23 3. PAIN: How bad is the pain?   (Scale 0-10; or none, mild, moderate or severe)     Sinus pain and sinus headache, rates pain about a 6-7 4. FEVER: Do you have a fever? If Yes, ask: What is it, how was it measured, and when did it start?      Yes, low grade- 99.5 5. SYMPTOMS: Are there any other symptoms you're concerned about? If Yes, ask: When did it start?     Stomach issues/nausea after starting antibiotic, denies vomiting  Protocols used: Sinus Infection on Antibiotic Follow-up Call-[facility]

## 2023-09-22 NOTE — Telephone Encounter (Signed)
 Thank you for rechecking patient.

## 2023-09-22 NOTE — Progress Notes (Signed)
 Established Patient Office Visit  Subjective   Patient ID: [name], female    DOB: May 24, 1955  Age: 68 y.o. MRN: [id number]  Chief Complaint  Patient presents with   Sinus Problem    Saw Dr. Cleatus on Tuesday and was given doxycycline ; patient still taking and is making her nauseous. Patient sx the same as they were when she was seen; no new sx. Low grade fever, sinus congestion, slight cough in the mornings. No testing was done at her appt with Dr. Cleatus. Patient is also taking tylenol for sx.     Sinus Problem Associated symptoms include congestion and coughing. Pertinent negatives include no chills, ear pain, headaches, shortness of breath or sore throat.    [name] is a 68 year old female, patient of Dr. Cleatus, with past medical history of anxiety, HLD, smoking presents today for an acute visit.   She was evaluated on 09/19/23 as her symptoms had been going on for two weeks, doxycycline  100 mg BID was started. She is on day 4 of her antibiotics.   She reports that doxycycline  is making her nauseous; symptoms are same with no new symptoms. She has a low grade fever, sinus congestion, cough in the mornings. She has been using tylenol as needed for the fever. She had a fever (she did not check) this morning and took tylenol at 7 am. She has a post nasal drip and non productive cough.    Patient Active Problem List   Diagnosis Date Noted   Fever 09/22/2023   Acute non-recurrent maxillary sinusitis 05/25/2023   Medicare welcome exam 09/21/2022   Smoking 09/21/2022   Recurrent cold sores 09/21/2022   Healthcare maintenance 05/29/2022   Advance care planning 05/29/2022   Anxiety 05/29/2022   HLD (hyperlipidemia) 05/29/2022   Joint pain 06/24/2021   Past Medical History:  Diagnosis Date   Anxiety    Hyperlipidemia    Past Surgical History:  Procedure Laterality Date   APPENDECTOMY     HEEL SPUR SURGERY Bilateral    Allergies  Allergen Reactions   Pravastatin  Sodium     Other reaction(s): leg cramps         09/21/2023   10:32 AM 09/19/2023    9:06 AM 05/25/2023   11:13 AM  Depression screen PHQ 2/9  Decreased Interest 3 3 0  Down, Depressed, Hopeless 2 1 0  PHQ - 2 Score 5 4 0  Altered sleeping 0 2   Tired, decreased energy 0 3   Change in appetite 0 1   Feeling bad or failure about yourself  0 0   Trouble concentrating 0 1   Moving slowly or fidgety/restless 0 0   Suicidal thoughts 0 0   PHQ-9 Score 5 11   Difficult doing work/chores Not difficult at all Somewhat difficult        09/19/2023    9:07 AM 09/19/2022    3:23 PM 05/27/2022   10:09 AM  GAD 7 : Generalized Anxiety Score  Nervous, Anxious, on Edge 2 1 1   Control/stop worrying 1 0 1  Worry too much - different things 1 0 1  Trouble relaxing 1 0 0  Restless 0 1 0  Easily annoyed or irritable 1 0 0  Afraid - awful might happen 0 0 0  Total GAD 7 Score 6 2 3   Anxiety Difficulty Somewhat difficult Not difficult at all Not difficult at all      Review of Systems  Constitutional:  Negative for chills and fever.  HENT:  Positive for congestion and sinus pain. Negative for ear pain and sore throat.   Respiratory:  Positive for cough. Negative for shortness of breath.   Cardiovascular:  Negative for chest pain.  Gastrointestinal:  Negative for abdominal pain, constipation, diarrhea, heartburn, nausea and vomiting.  Genitourinary:  Negative for dysuria, frequency and urgency.  Neurological:  Negative for dizziness and headaches.  Endo/Heme/Allergies:  Negative for polydipsia.  Psychiatric/Behavioral:  Negative for depression and suicidal ideas. The patient is not nervous/anxious.       Objective:     BP 120/82   Pulse 82   Temp 98.4 F (36.9 C) (Temporal)   Ht 5' 2 (1.575 m)   Wt 145 lb 3.2 oz (65.9 kg)   SpO2 98%   BMI 26.56 kg/m  BP Readings from Last 3 Encounters:  09/22/23 120/82  09/21/23 124/78  09/19/23 124/78   Wt Readings from Last 3 Encounters:   09/22/23 145 lb 3.2 oz (65.9 kg)  09/21/23 145 lb (65.8 kg)  09/19/23 145 lb 9.6 oz (66 kg)      Physical Exam Vitals and nursing note reviewed.  Constitutional:      Appearance: Normal appearance.  HENT:     Right Ear: Tympanic membrane, ear canal and external ear normal.     Left Ear: Tympanic membrane, ear canal and external ear normal.     Nose: Congestion present.     Right Sinus: Maxillary sinus tenderness and frontal sinus tenderness present.     Left Sinus: Maxillary sinus tenderness and frontal sinus tenderness present.     Mouth/Throat:     Pharynx: Uvula midline. Postnasal drip present.  Eyes:     Conjunctiva/sclera: Conjunctivae normal.  Cardiovascular:     Rate and Rhythm: Normal rate and regular rhythm.     Pulses: Normal pulses.     Heart sounds: Normal heart sounds.  Pulmonary:     Effort: Pulmonary effort is normal.     Breath sounds: Normal breath sounds. No wheezing or rhonchi.  Neurological:     Mental Status: She is alert and oriented to person, place, and time.  Psychiatric:        Mood and Affect: Mood normal.        Behavior: Behavior normal.        Thought Content: Thought content normal.        Judgment: Judgment normal.      Results for orders placed or performed in visit on 09/22/23  POC COVID-19  Result Value Ref Range   SARS Coronavirus 2 Ag Negative Negative  POCT Influenza A/B  Result Value Ref Range   Influenza A, POC Negative Negative   Influenza B, POC Negative Negative       The 10-year ASCVD risk score (Arnett DK, et al., 2019) is: 10.3%    Assessment & Plan:  Fever, unspecified fever cause Assessment & Plan: POC covid and flu negative.  Orders: -     POC COVID-19 BinaxNow -     POCT Influenza A/B  Acute non-recurrent maxillary sinusitis Assessment & Plan: No improvement.  Given the nausea with doxycycline , will change to Augmentin .  Advised patient to take with food.   Rest, fluids, tylenol as needed and  humidifier encouraged.   She will update if symptoms worsen or do not improve.  Orders: -     Amoxicillin -Pot Clavulanate; Take 1 tablet by mouth 2 (two) times daily for 7 days.  Dispense: 14  tablet; Refill: 0     Return if symptoms worsen or fail to improve.    Carrol Aurora, NP

## 2023-09-22 NOTE — Assessment & Plan Note (Signed)
 No improvement.  Given the nausea with doxycycline , will change to Augmentin .  Advised patient to take with food.   Rest, fluids, tylenol as needed and humidifier encouraged.   She will update if symptoms worsen or do not improve.

## 2023-10-16 ENCOUNTER — Encounter: Payer: Self-pay | Admitting: Sports Medicine

## 2023-10-16 DIAGNOSIS — H52223 Regular astigmatism, bilateral: Secondary | ICD-10-CM | POA: Diagnosis not present

## 2023-10-16 DIAGNOSIS — H524 Presbyopia: Secondary | ICD-10-CM | POA: Diagnosis not present

## 2023-11-04 ENCOUNTER — Other Ambulatory Visit: Payer: Self-pay | Admitting: Family Medicine

## 2023-11-04 DIAGNOSIS — M2550 Pain in unspecified joint: Secondary | ICD-10-CM

## 2023-11-06 NOTE — Telephone Encounter (Signed)
 Sent. Thanks.

## 2023-11-06 NOTE — Telephone Encounter (Signed)
 Meloxicam  Last filled:  08/06/23 Last OV:  09/19/23, arthralgia Next OV:  11/23/23, annual exam

## 2023-11-07 ENCOUNTER — Encounter: Payer: Self-pay | Admitting: Family Medicine

## 2023-11-08 NOTE — Telephone Encounter (Signed)
 Please call to set up labs

## 2023-11-10 ENCOUNTER — Other Ambulatory Visit: Payer: Self-pay | Admitting: Family Medicine

## 2023-11-10 DIAGNOSIS — E785 Hyperlipidemia, unspecified: Secondary | ICD-10-CM

## 2023-11-14 ENCOUNTER — Other Ambulatory Visit (INDEPENDENT_AMBULATORY_CARE_PROVIDER_SITE_OTHER): Payer: Medicare (Managed Care)

## 2023-11-14 DIAGNOSIS — E785 Hyperlipidemia, unspecified: Secondary | ICD-10-CM

## 2023-11-14 LAB — LIPID PANEL
Cholesterol: 185 mg/dL (ref 0–200)
HDL: 44.9 mg/dL (ref >39.00)
LDL Cholesterol: 125 mg/dL — ABNORMAL HIGH (ref 0–99)
NonHDL: 140.19
Total CHOL/HDL Ratio: 4
Triglycerides: 75 mg/dL (ref 0.0–149.0)
VLDL: 15 mg/dL (ref 0.0–40.0)

## 2023-11-14 LAB — COMPREHENSIVE METABOLIC PANEL WITH GFR
ALT: 13 U/L (ref 0–35)
AST: 19 U/L (ref 0–37)
Albumin: 4.2 g/dL (ref 3.5–5.2)
Alkaline Phosphatase: 57 U/L (ref 39–117)
BUN: 11 mg/dL (ref 6–23)
CO2: 28 meq/L (ref 19–32)
Calcium: 9.2 mg/dL (ref 8.4–10.5)
Chloride: 103 meq/L (ref 96–112)
Creatinine, Ser: 0.69 mg/dL (ref 0.40–1.20)
GFR: 90.02 mL/min (ref >60.00)
Glucose, Bld: 93 mg/dL (ref 70–99)
Potassium: 4 meq/L (ref 3.5–5.1)
Sodium: 137 meq/L (ref 135–145)
Total Bilirubin: 0.4 mg/dL (ref 0.2–1.2)
Total Protein: 7.3 g/dL (ref 6.0–8.3)

## 2023-11-14 LAB — CBC WITH DIFFERENTIAL/PLATELET
Basophils Absolute: 0 K/uL (ref 0.0–0.1)
Basophils Relative: 0.4 % (ref 0.0–3.0)
Eosinophils Absolute: 0.1 K/uL (ref 0.0–0.7)
Eosinophils Relative: 1.6 % (ref 0.0–5.0)
HCT: 39 % (ref 36.0–46.0)
Hemoglobin: 13.2 g/dL (ref 12.0–15.0)
Lymphocytes Relative: 34.5 % (ref 12.0–46.0)
Lymphs Abs: 1.7 K/uL (ref 0.7–4.0)
MCHC: 33.9 g/dL (ref 30.0–36.0)
MCV: 92.9 fl (ref 78.0–100.0)
Monocytes Absolute: 0.5 K/uL (ref 0.1–1.0)
Monocytes Relative: 10.4 % (ref 3.0–12.0)
Neutro Abs: 2.6 K/uL (ref 1.4–7.7)
Neutrophils Relative %: 53.1 % (ref 43.0–77.0)
Platelets: 229 K/uL (ref 150.0–400.0)
RBC: 4.2 Mil/uL (ref 3.87–5.11)
RDW: 13.9 % (ref 11.5–15.5)
WBC: 4.9 K/uL (ref 4.0–10.5)

## 2023-11-15 ENCOUNTER — Ambulatory Visit: Payer: Self-pay | Admitting: Family Medicine

## 2023-11-18 ENCOUNTER — Other Ambulatory Visit: Payer: Self-pay | Admitting: Family Medicine

## 2023-11-18 DIAGNOSIS — E785 Hyperlipidemia, unspecified: Secondary | ICD-10-CM

## 2023-11-22 ENCOUNTER — Ambulatory Visit: Payer: Medicare (Managed Care) | Admitting: Sports Medicine

## 2023-11-23 ENCOUNTER — Encounter: Payer: Self-pay | Admitting: Family Medicine

## 2023-11-23 ENCOUNTER — Ambulatory Visit: Payer: Medicare (Managed Care) | Admitting: Family Medicine

## 2023-11-23 VITALS — BP 114/78 | HR 63 | Temp 99.2°F | Ht 62.5 in | Wt 139.5 lb

## 2023-11-23 DIAGNOSIS — Z23 Encounter for immunization: Secondary | ICD-10-CM

## 2023-11-23 DIAGNOSIS — F419 Anxiety disorder, unspecified: Secondary | ICD-10-CM

## 2023-11-23 DIAGNOSIS — Z1211 Encounter for screening for malignant neoplasm of colon: Secondary | ICD-10-CM

## 2023-11-23 DIAGNOSIS — E785 Hyperlipidemia, unspecified: Secondary | ICD-10-CM

## 2023-11-23 DIAGNOSIS — Z7189 Other specified counseling: Secondary | ICD-10-CM

## 2023-11-23 DIAGNOSIS — Z0000 Encounter for general adult medical examination without abnormal findings: Secondary | ICD-10-CM | POA: Diagnosis not present

## 2023-11-23 MED ORDER — ALPRAZOLAM 0.5 MG PO TABS: 0.5000 mg | ORAL_TABLET | Freq: Every evening | ORAL | 5 refills | Status: AC | PRN

## 2023-11-23 MED ORDER — LOVASTATIN 10 MG PO TABS: 10.0000 mg | ORAL_TABLET | Freq: Every day | ORAL | 3 refills | Status: AC

## 2023-11-23 NOTE — Progress Notes (Signed)
 More tearful and anxious for the last few months.  Unclear if related to prev steroid injection, d/w pt.  Sx started after that.  She has ortho f/u pending.  She doesn't feel unwell o/w, after prev sinus infection resolved.  Still on lexapro  at baseline with prn BZD w/o ADE on med. Historically those meds helped.  No SI/HI.    Discussed half life of zilretta  and that if med related her sx should improve. This month was better than last month.    Elevated Cholesterol: Using medications without problems: yes Muscle aches: no Diet compliance: d/w pt.   Exercise: d/w pt.  Labs d/w pt.    Shingles prev done.  Covid prev done.  Flu d/w pt.  PNA d/w pt.  Tetanus d/w pt.   DXA 2025.  D/w patient mz:neupnwd for colon cancer screening, including IFOB vs. colonoscopy.  Risks and benefits of both were discussed and patient voiced understanding.  Pt elects for: cologuard.   Mammogram d/w pt.  D/w pt about follow up.   Advance directive d/w pt. Daughter, son, and daughter in law all equally designated if patient were incapacitated.  D/w pt about smoking cessation.  D/w pt about options.    She had [facility] and didn't tolerate/benefit from meds for ADD.  Allergy list updated.  She can manage as is.  She can update me as needed.  Meds, vitals, and allergies reviewed.   ROS: Per HPI unless specifically indicated in ROS section   GEN: nad, alert and oriented HEENT: mucous membranes moist NECK: supple w/o LA CV: rrr.  PULM: ctab, no inc wob ABD: soft, +bs EXT: no edema SKIN: no acute rash

## 2023-11-23 NOTE — Patient Instructions (Signed)
Flu shot today.  Update me as needed.  Take care.  Glad to see you. 

## 2023-11-26 NOTE — Assessment & Plan Note (Signed)
 Still on lexapro  at baseline with prn BZD w/o ADE on med. Historically those meds helped.  No SI/HI.    Her symptoms were worse after previous steroid injection.  Discussed half life of zilretta  and that if med related her sx should improve. This month was better than last month.  I suspect the steroid shot did affect her symptoms, discussed.  I would avoid that shot in the future, discussed, and she can update me as needed.  Continue Lexapro  with as needed alprazolam  as needed in the meantime.  Okay for outpatient follow-up.

## 2023-11-26 NOTE — Assessment & Plan Note (Signed)
 Shingles prev done.  Covid prev done.  Flu d/w pt.  PNA d/w pt.  Tetanus d/w pt.   DXA 2025.  D/w patient mz:neupnwd for colon cancer screening, including IFOB vs. colonoscopy.  Risks and benefits of both were discussed and patient voiced understanding.  Pt elects for: cologuard.   Mammogram d/w pt.  D/w pt about follow up.   Advance directive d/w pt. Daughter, son, and daughter in law all equally designated if patient were incapacitated.  D/w pt about smoking cessation.  D/w pt about options.

## 2023-11-26 NOTE — Assessment & Plan Note (Signed)
Advance directive d/w pt.  Daughter, son, and daughter in law all equally designated if patient were incapacitated.

## 2023-11-26 NOTE — Assessment & Plan Note (Signed)
 Continue work on diet and exercise.  Continue lovastatin .  Rationale for use discussed.

## 2023-11-30 ENCOUNTER — Ambulatory Visit: Payer: Medicare (Managed Care) | Admitting: Orthopedic Surgery

## 2023-11-30 DIAGNOSIS — M1712 Unilateral primary osteoarthritis, left knee: Secondary | ICD-10-CM | POA: Diagnosis not present

## 2023-11-30 MED ORDER — TRAMADOL HCL 50 MG PO TABS: ORAL_TABLET | ORAL | 0 refills | Status: DC

## 2023-12-01 ENCOUNTER — Encounter: Payer: Self-pay | Admitting: Orthopedic Surgery

## 2023-12-01 NOTE — Progress Notes (Signed)
 Office Visit Note   Patient: [name]           Date of Birth: Jul 19, 1955           MRN: [id number] Visit Date: 11/30/2023 Requested by: Cleatus Arlyss RAMAN, MD [address] [place],  [place] [address] PCP: Cleatus Arlyss RAMAN, MD  Subjective: Chief Complaint  Patient presents with   Left Knee - Pain    HPI: [name] is a 68 y.o. female who presents to the office reporting left knee pain of several years duration.  Been severe over the past year.  Has tried injections including cortisone and gel and Zilretta  with minimal relief.  Has had an MRI scan done in June of this year which demonstrates end-stage arthritis predominantly in the medial compartment with fairly significant bone edema in both the medial femoral condyle and medial tibial plateau.  Some patellofemoral arthritis is also present.  Patient describes some popping and stiffness and swelling with weakness.  She also states she feels the Baker's cyst that she has in the back of her knee which is uncomfortable at times.  No prior knee surgery.  She ambulates with a limp.  She decreases her activity because of the knee limits her in many ways.  Tylenol will only a little bit.  She is retired.  Has a daughter who is 10 minutes away.  Several good friends for social support.  She has 1 city block of walking endurance before pain alters her gait and ability to get around.  She goes up steps 1 at a time.  Does have a known history of osteopenia but she takes vitamin D .  She lives on a condo with no stairs.  No personal history or family history of DVT or pulmonary embolism..                ROS: All systems reviewed are negative as they relate to the chief complaint within the history of present illness.  Patient denies fevers or chills.  Assessment & Plan: Visit Diagnoses:  1. Arthritis of left knee     Plan: Impression is left knee pain with significant medial compartment arthritis and fair amount of edema in the medial femoral  condyle and medial tibial plateau.  Patellofemoral arthritis is also present.  Meniscus extruded.  Nothing arthroscopically treatable in this knee which is going to give her predictable pain relief.  Best option for [name] would be cemented total knee replacement.  Risks and benefits of that intervention are discussed with the patient including not limited to infection or vessel damage incomplete pain relief as well as incomplete restoration of function.  The grueling nature of the rehabilitative process was also discussed.  Patient understands and feels like her best option moving forward based on failure of conservative treatment would be knee replacement.  I agree with her assessment.  Plan at this time to post knee replacement for sometime after Christmas.  Ultram  prescribed in the meantime for pain relief.  Continue with nonweightbearing quad strengthening exercises.  All questions answered.  Follow-Up Instructions: No follow-ups on file.   Orders:  No orders of the defined types were placed in this encounter.  Meds ordered this encounter  Medications   traMADol  (ULTRAM ) 50 MG tablet    Sig: 1 po q 8hrs prn pain    Dispense:  25 tablet    Refill:  0      Procedures: No procedures performed   Clinical Data: No  additional findings.  Objective: Vital Signs: There were no vitals taken for this visit.  Physical Exam:  Constitutional: Patient appears well-developed HEENT:  Head: Normocephalic Eyes:EOM are normal Neck: Normal range of motion Cardiovascular: Normal rate Pulmonary/chest: Effort normal Neurologic: Patient is alert Skin: Skin is warm Psychiatric: Patient has normal mood and affect  Ortho Exam: Examination of the left knee demonstrates mild varus deformity.  Medial greater than lateral joint line tenderness.  Collateral cruciate ligaments are stable.  Pedal pulses palpable.  Range of motion is about 0-1 20.  No groin pain with internal or external rotation of the leg.   No other masses lymphadenopathy or skin changes noted in that left knee region but she does have a palpable Baker's cyst.  Minimal effusion in the knee itself.  Specialty Comments:  No specialty comments available.  Imaging: No results found.   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   Healthcare maintenance 05/29/2022   Advance care planning 05/29/2022   Anxiety 05/29/2022   HLD (hyperlipidemia) 05/29/2022   Joint pain 06/24/2021   Past Medical History:  Diagnosis Date   Allergy    Seasonal allergies   Anxiety    Arthritis 2010   In hands and knee   Depression 2005   Hyperlipidemia     Family History  Problem Relation Age of Onset   Stroke Mother    Anxiety disorder Mother    Depression Mother    Hypertension Mother    Pulmonary fibrosis Father    Heart disease Father    Ulcerative colitis Father    Arthritis Sister    Hypertension Sister    Arthritis Maternal Grandmother    Alcohol abuse Maternal Grandfather    Arthritis Paternal Grandmother    ADD / ADHD Daughter    Cancer Maternal Uncle    Alcohol abuse Paternal Uncle    Vision loss Paternal Uncle    Colon cancer Neg Hx    Breast cancer Neg Hx     Past Surgical History:  Procedure Laterality Date   APPENDECTOMY     HEEL SPUR SURGERY Bilateral    Social History   Occupational History   Not on file  Tobacco Use   Smoking status: Every Day    Current packs/day: 0.50    Average packs/day: 0.5 packs/day for 40.0 years (20.0 ttl pk-yrs)    Types: Cigarettes   Smokeless tobacco: Never  Vaping Use   Vaping status: Never Used  Substance and Sexual Activity   Alcohol use: Yes    Alcohol/week: 1.0 standard drink of alcohol    Types: 1 Standard drinks or equivalent per week    Comment: Drink in social situations   Drug use: Not Currently    Types: Marijuana   Sexual activity: Not  Currently    Birth control/protection: Post-menopausal

## 2023-12-08 LAB — COLOGUARD: COLOGUARD: NEGATIVE

## 2023-12-10 ENCOUNTER — Ambulatory Visit: Payer: Self-pay | Admitting: Family Medicine

## 2023-12-11 ENCOUNTER — Encounter: Payer: Self-pay | Admitting: Orthopedic Surgery

## 2024-01-15 ENCOUNTER — Encounter: Payer: Self-pay | Admitting: Orthopedic Surgery

## 2024-01-24 NOTE — Progress Notes (Signed)
 Surgical Instructions   Your procedure is scheduled on January 22. Report to [facility] Main Entrance A at 5:30 A.M., then check in with the Admitting office. Any questions or running late day of surgery: call [phone]  Questions prior to your surgery date: call [phone], Monday-Friday, 8am-4pm. If you experience any cold or flu symptoms such as cough, fever, chills, shortness of breath, etc. between now and your scheduled surgery, please notify us  at the above number.     Remember:  Do not eat after midnight the night before your surgery  You may drink clear liquids until 4:30 am the morning of your surgery.   Clear liquids allowed are: Water, Non-Citrus Juices (without pulp), Carbonated Beverages, Clear Tea (no milk, honey, etc.), Black Coffee Only (NO MILK, CREAM OR POWDERED CREAMER of any kind), and Gatorade. Patient Instructions  The night before surgery:  No food after midnight. ONLY clear liquids after midnight  The day of surgery (if you do NOT have diabetes):  Drink ONE (1) Pre-Surgery Clear Ensure by 4:30 am the morning of surgery. Drink in one sitting. Do not sip.  This drink was given to you during your hospital  pre-op appointment visit.  Nothing else to drink after completing the  Pre-Surgery Clear Ensure.         If you have questions, please contact your surgeons office.    Take these medicines the morning of surgery with A SIP OF WATER  none  May take these medicines IF NEEDED: none   One week prior to surgery, STOP taking any Aspirin (unless otherwise instructed by your surgeon) Aleve, Naproxen, Ibuprofen, Motrin, Advil, Goody's, BC's, all herbal medications, fish oil, and non-prescription vitamins.                     Do NOT Smoke (Tobacco/Vaping) for 24 hours prior to your procedure.  If you use a CPAP at night, you may bring your mask/headgear for your overnight stay.   You will be asked to remove any contacts, glasses, piercing's, hearing  aid's, dentures/partials prior to surgery. Please bring cases for these items if needed.    Your surgeon will determine if you are to be admitted or discharged the same day.  Patients discharged the day of surgery will not be allowed to drive home, and someone needs to stay with them for 24 hours.  SURGICAL WAITING ROOM VISITATION Patients may have no more than 2 support people in the waiting area - these visitors may rotate.   Pre-op nurse will coordinate an appropriate time for 2 ADULT support persons, who may not rotate, to accompany patient in pre-op.  Children under the age of 40 must have an adult with them who is not the patient and must remain in the main waiting area with an adult.  If the patient needs to stay at the hospital during part of their recovery, the visitor guidelines for inpatient rooms apply.  Please refer to the [facility] website for the visitor guidelines for any additional information.   If you received a COVID test during your pre-op visit  it is requested that you wear a mask when out in public, stay away from anyone that may not be feeling well and notify your surgeon if you develop symptoms. If you have been in contact with anyone that has tested positive in the last 10 days please notify you surgeon.      Pre-operative 4 CHG Bathing Instructions   You can play  a key role in reducing the risk of infection after surgery. Your skin needs to be as free of germs as possible. You can reduce the number of germs on your skin by washing with CHG (chlorhexidine gluconate) soap before surgery. CHG is an antiseptic soap that kills germs and continues to kill germs even after washing.   DO NOT use if you have an allergy to chlorhexidine/CHG or antibacterial soaps. If your skin becomes reddened or irritated, stop using the CHG and notify one of our RNs at [phone].   Please shower with the CHG soap starting 4 days before surgery using the following schedule:      Please keep in mind the following:  DO NOT shave, including legs and underarms, starting the day of your first shower.   You may shave your face at any point before/day of surgery.  Place clean sheets on your bed the day you start using CHG soap. Use a clean washcloth (not used since being washed) for each shower. DO NOT sleep with pets once you start using the CHG.   CHG Shower Instructions:  Wash your face and private area with normal soap. If you choose to wash your hair, wash first with your normal shampoo.  After you use shampoo/soap, rinse your hair and body thoroughly to remove shampoo/soap residue.  Turn the water OFF and apply  bottle of CHG soap to a CLEAN washcloth.  Apply CHG soap ONLY FROM YOUR NECK DOWN TO YOUR TOES (washing for 3-5 minutes)  DO NOT use CHG soap on face, private areas, open wounds, or sores.  Pay special attention to the area where your surgery is being performed.  If you are having back surgery, having someone wash your back for you may be helpful. Wait 2 minutes after CHG soap is applied, then you may rinse off the CHG soap.  Pat dry with a clean towel  Put on clean clothes/pajamas   If you choose to wear lotion, please use ONLY the CHG-compatible lotions that are listed below.  Additional instructions for the day of surgery:  If you choose, you may shower the morning of surgery with an antibacterial soap.  DO NOT APPLY any lotions, deodorants, cologne, or perfumes.   Do not bring valuables to the hospital. [facility] is not responsible for any belongings/valuables. Do not wear nail polish, gel polish, artificial nails, or any other type of covering on natural nails (fingers and toes) Do not wear jewelry or makeup Put on clean/comfortable clothes.  Please brush your teeth.  Ask your nurse before applying any prescription medications to the skin.     CHG Compatible Lotions   Aveeno Moisturizing lotion  Cetaphil Moisturizing Cream   Cetaphil Moisturizing Lotion  Clairol Herbal Essence Moisturizing Lotion, Dry Skin  Clairol Herbal Essence Moisturizing Lotion, Extra Dry Skin  Clairol Herbal Essence Moisturizing Lotion, Normal Skin  Curel Age Defying Therapeutic Moisturizing Lotion with Alpha Hydroxy  Curel Extreme Care Body Lotion  Curel Soothing Hands Moisturizing Hand Lotion  Curel Therapeutic Moisturizing Cream, Fragrance-Free  Curel Therapeutic Moisturizing Lotion, Fragrance-Free  Curel Therapeutic Moisturizing Lotion, Original Formula  Eucerin Daily Replenishing Lotion  Eucerin Dry Skin Therapy Plus Alpha Hydroxy Crme  Eucerin Dry Skin Therapy Plus Alpha Hydroxy Lotion  Eucerin Original Crme  Eucerin Original Lotion  Eucerin Plus Crme Eucerin Plus Lotion  Eucerin TriLipid Replenishing Lotion  Keri Anti-Bacterial Hand Lotion  Keri Deep Conditioning Original Lotion Dry Skin Formula Softly Scented  Keri Deep Conditioning Original Lotion,  Fragrance Free Sensitive Skin Formula  Keri Lotion Fast Absorbing Fragrance Free Sensitive Skin Formula  Keri Lotion Fast Absorbing Softly Scented Dry Skin Formula  Keri Original Lotion  Keri Skin Renewal Lotion Keri Silky Smooth Lotion  Keri Silky Smooth Sensitive Skin Lotion  Nivea Body Creamy Conditioning Oil  Nivea Body Extra Enriched Lotion  Nivea Body Original Lotion  Nivea Body Sheer Moisturizing Lotion Nivea Crme  Nivea Skin Firming Lotion  NutraDerm 30 Skin Lotion  NutraDerm Skin Lotion  NutraDerm Therapeutic Skin Cream  NutraDerm Therapeutic Skin Lotion  ProShield Protective Hand Cream  Provon moisturizing lotion  Please read over the following fact sheets that you were given.

## 2024-01-25 ENCOUNTER — Other Ambulatory Visit: Payer: Self-pay

## 2024-01-25 ENCOUNTER — Encounter (HOSPITAL_COMMUNITY): Payer: Self-pay

## 2024-01-25 ENCOUNTER — Encounter (HOSPITAL_COMMUNITY)
Admission: RE | Admit: 2024-01-25 | Discharge: 2024-01-25 | Disposition: A | Discharge status: Home / Self Care | Source: Ambulatory Visit | Attending: Orthopedic Surgery | Admitting: Orthopedic Surgery

## 2024-01-25 VITALS — BP 144/89 | HR 76 | Temp 97.7°F | Resp 17 | Ht 62.5 in | Wt 139.5 lb

## 2024-01-25 DIAGNOSIS — Z01818 Encounter for other preprocedural examination: Secondary | ICD-10-CM

## 2024-01-25 DIAGNOSIS — Z01812 Encounter for preprocedural laboratory examination: Secondary | ICD-10-CM | POA: Diagnosis not present

## 2024-01-25 HISTORY — DX: Attention-deficit hyperactivity disorder, unspecified type: F90.9

## 2024-01-25 HISTORY — DX: Personal history of other diseases of the respiratory system: Z87.09

## 2024-01-25 HISTORY — DX: Personal history of pneumonia (recurrent): Z87.01

## 2024-01-25 LAB — BASIC METABOLIC PANEL WITH GFR
Anion gap: 10 (ref 5–15)
BUN: 12 mg/dL (ref 8–23)
CO2: 27 mmol/L (ref 22–32)
Calcium: 9.3 mg/dL (ref 8.9–10.3)
Chloride: 100 mmol/L (ref 98–111)
Creatinine, Ser: 0.64 mg/dL (ref 0.44–1.00)
GFR, Estimated: >60 mL/min
Glucose, Bld: 90 mg/dL (ref 70–99)
Potassium: 3.8 mmol/L (ref 3.5–5.1)
Sodium: 136 mmol/L (ref 135–145)

## 2024-01-25 LAB — URINALYSIS, W/ REFLEX TO CULTURE (INFECTION SUSPECTED)
Bacteria, UA: NONE SEEN
Bilirubin Urine: NEGATIVE
Glucose, UA: NEGATIVE mg/dL
Hgb urine dipstick: NEGATIVE
Ketones, ur: NEGATIVE mg/dL
Leukocytes,Ua: NEGATIVE
Nitrite: NEGATIVE
Protein, ur: NEGATIVE mg/dL
Specific Gravity, Urine: 1.008 (ref 1.005–1.030)
pH: 7 (ref 5.0–8.0)

## 2024-01-25 LAB — CBC
HCT: 41.5 % (ref 36.0–46.0)
Hemoglobin: 14.1 g/dL (ref 12.0–15.0)
MCH: 31.6 pg (ref 26.0–34.0)
MCHC: 34 g/dL (ref 30.0–36.0)
MCV: 93 fL (ref 80.0–100.0)
Platelets: 257 K/uL (ref 150–400)
RBC: 4.46 MIL/uL (ref 3.87–5.11)
RDW: 12.6 % (ref 11.5–15.5)
WBC: 6.2 K/uL (ref 4.0–10.5)
nRBC: 0 % (ref 0.0–0.2)

## 2024-01-25 LAB — SURGICAL PCR SCREEN
MRSA, PCR: NEGATIVE
Staphylococcus aureus: NEGATIVE

## 2024-01-25 NOTE — Progress Notes (Signed)
 PCP - Arlyss Solian Cardiologist - denies   PPM/ICD - denies   Chest x-ray - NA EKG - denies Stress Test - denies ECHO - denies Cardiac Cath - denies  Sleep Study - denies   Fasting Blood Sugar - NA  Last dose of GLP1 agonist- denies   Blood Thinner Instructions: NA Aspirin Instructions: denies  ERAS Protcol - NPO + Ensure   COVID TEST- NA   Anesthesia review: no  Patient denies shortness of breath, fever, cough and chest pain at PAT appointment   All instructions explained to the patient, with a verbal understanding of the material. Patient agrees to go over the instructions while at home for a better understanding. The opportunity to ask questions was provided.

## 2024-01-30 NOTE — Care Plan (Signed)
 Ortho Bundle Case Management Note  Patient Details  Name: LORYN HAACKE MRN: [id number] Date of Birth: [birth year]  [facility] RNCM call to patient and discussed her upcoming Left total knee arthroplasty with Dr. Addie on 02/01/24 at [facility]. She is agreeable to case management. Her plan is to return home with assistance from her daughter and friends/sister when needed. She has a RW already. [employer] will provide her home CPM after discharge. Scheduled for Friday. Anticipate HHPT will be needed after a short hospital stay. Referral made to [facility] after choice provided. Reviewed all post op care instructions and questions answered. Will continue to follow for needs.                DME Arranged:  CPM (Patient has RW already) DME Agency:  [employer]  HH Arranged:  PT HH Agency:  [facility]  Additional Comments: Please contact me with any questions of if this plan should need to change.  Tylene Ned, RN, BSN, [employer]  [phone] 01/30/2024, 4:11 PM

## 2024-01-31 MED ORDER — TRANEXAMIC ACID 1000 MG/10ML IV SOLN
2000.0000 mg | INTRAVENOUS | Status: DC
  Filled 2024-01-31: qty 20

## 2024-02-01 ENCOUNTER — Ambulatory Visit (HOSPITAL_COMMUNITY): Payer: Self-pay | Admitting: Certified Registered Nurse Anesthetist

## 2024-02-01 ENCOUNTER — Encounter (HOSPITAL_COMMUNITY)
Admission: RE | Disposition: A | Discharge status: Home / Self Care | Payer: Self-pay | Source: Home / Self Care | Attending: Orthopedic Surgery

## 2024-02-01 ENCOUNTER — Encounter (HOSPITAL_COMMUNITY): Payer: Self-pay | Admitting: Orthopedic Surgery

## 2024-02-01 ENCOUNTER — Observation Stay (HOSPITAL_COMMUNITY)
Admission: RE | Admit: 2024-02-01 | Discharge: 2024-02-02 | Disposition: A | Discharge status: Home / Self Care | Payer: Medicare (Managed Care) | Attending: Orthopedic Surgery | Admitting: Orthopedic Surgery

## 2024-02-01 ENCOUNTER — Other Ambulatory Visit: Payer: Self-pay

## 2024-02-01 DIAGNOSIS — Z01818 Encounter for other preprocedural examination: Principal | ICD-10-CM

## 2024-02-01 DIAGNOSIS — F17210 Nicotine dependence, cigarettes, uncomplicated: Secondary | ICD-10-CM | POA: Insufficient documentation

## 2024-02-01 DIAGNOSIS — M1712 Unilateral primary osteoarthritis, left knee: Secondary | ICD-10-CM

## 2024-02-01 DIAGNOSIS — M25562 Pain in left knee: Secondary | ICD-10-CM | POA: Diagnosis present

## 2024-02-01 DIAGNOSIS — Z96652 Presence of left artificial knee joint: Secondary | ICD-10-CM

## 2024-02-01 MED ORDER — FENTANYL CITRATE (PF) 100 MCG/2ML IJ SOLN
25.0000 ug | INTRAMUSCULAR | Status: DC | PRN
  Administered 2024-02-01 (×4): 50 ug via INTRAVENOUS

## 2024-02-01 MED ORDER — DEXAMETHASONE SOD PHOSPHATE PF 10 MG/ML IJ SOLN
INTRAMUSCULAR | Status: DC | PRN
  Administered 2024-02-01: 5 mg via INTRAVENOUS

## 2024-02-01 MED ORDER — CEFAZOLIN SODIUM-DEXTROSE 2-4 GM/100ML-% IV SOLN
2.0000 g | INTRAVENOUS | Status: AC
  Administered 2024-02-01: 2 g via INTRAVENOUS
  Filled 2024-02-01: qty 100

## 2024-02-01 MED ORDER — MENTHOL 3 MG MT LOZG: 1.0000 | LOZENGE | OROMUCOSAL | Status: DC | PRN

## 2024-02-01 MED ORDER — ALPRAZOLAM 0.5 MG PO TABS: 0.5000 mg | ORAL_TABLET | Freq: Every evening | ORAL | Status: DC | PRN

## 2024-02-01 MED ORDER — ONDANSETRON HCL 4 MG/2ML IJ SOLN
INTRAMUSCULAR | Status: AC
  Filled 2024-02-01: qty 2

## 2024-02-01 MED ORDER — OXYCODONE HCL 5 MG PO TABS
5.0000 mg | ORAL_TABLET | ORAL | Status: DC | PRN
  Administered 2024-02-01 – 2024-02-02 (×6): 10 mg via ORAL
  Filled 2024-02-01 (×6): qty 2

## 2024-02-01 MED ORDER — DEXAMETHASONE SOD PHOSPHATE PF 10 MG/ML IJ SOLN
INTRAMUSCULAR | Status: AC
  Filled 2024-02-01: qty 1

## 2024-02-01 MED ORDER — MORPHINE SULFATE (PF) 4 MG/ML IV SOLN
INTRAVENOUS | Status: DC | PRN
  Administered 2024-02-01: 8 mg via SUBCUTANEOUS

## 2024-02-01 MED ORDER — VANCOMYCIN HCL 1000 MG IV SOLR
INTRAVENOUS | Status: AC
  Filled 2024-02-01: qty 20

## 2024-02-01 MED ORDER — POVIDONE-IODINE 10 % EX SWAB
2.0000 | Freq: Once | CUTANEOUS | Status: AC
  Administered 2024-02-01: 2 via TOPICAL

## 2024-02-01 MED ORDER — TRANEXAMIC ACID-NACL 1000-0.7 MG/100ML-% IV SOLN
1000.0000 mg | INTRAVENOUS | Status: AC
  Administered 2024-02-01: 1000 mg via INTRAVENOUS
  Filled 2024-02-01: qty 100

## 2024-02-01 MED ORDER — CEFAZOLIN SODIUM-DEXTROSE 2-4 GM/100ML-% IV SOLN
2.0000 g | Freq: Three times a day (TID) | INTRAVENOUS | Status: AC
  Administered 2024-02-01 – 2024-02-02 (×2): 2 g via INTRAVENOUS
  Filled 2024-02-01 (×2): qty 100

## 2024-02-01 MED ORDER — ONDANSETRON HCL 4 MG/2ML IJ SOLN: 4.0000 mg | Freq: Four times a day (QID) | INTRAMUSCULAR | Status: DC | PRN

## 2024-02-01 MED ORDER — PROPOFOL 10 MG/ML IV BOLUS
INTRAVENOUS | Status: DC | PRN
  Administered 2024-02-01: 30 mg via INTRAVENOUS

## 2024-02-01 MED ORDER — DOCUSATE SODIUM 100 MG PO CAPS
100.0000 mg | ORAL_CAPSULE | Freq: Two times a day (BID) | ORAL | Status: DC
  Administered 2024-02-01 – 2024-02-02 (×2): 100 mg via ORAL
  Filled 2024-02-01 (×2): qty 1

## 2024-02-01 MED ORDER — HYDROMORPHONE HCL 1 MG/ML IJ SOLN
0.5000 mg | INTRAMUSCULAR | Status: DC | PRN
  Filled 2024-02-01: qty 0.5

## 2024-02-01 MED ORDER — POVIDONE-IODINE 7.5 % EX SOLN: Freq: Once | CUTANEOUS | Status: DC

## 2024-02-01 MED ORDER — SODIUM CHLORIDE 0.9 % IV SOLN: Freq: Once | INTRAVENOUS | Status: AC

## 2024-02-01 MED ORDER — ACETAMINOPHEN 10 MG/ML IV SOLN: 1000.0000 mg | Freq: Once | INTRAVENOUS | Status: DC | PRN

## 2024-02-01 MED ORDER — BUPIVACAINE HCL (PF) 0.25 % IJ SOLN
INTRAMUSCULAR | Status: AC
  Filled 2024-02-01: qty 30

## 2024-02-01 MED ORDER — CLONIDINE HCL (ANALGESIA) 100 MCG/ML EP SOLN
EPIDURAL | Status: AC
  Filled 2024-02-01: qty 10

## 2024-02-01 MED ORDER — IRRISEPT - 450ML BOTTLE WITH 0.05% CHG IN STERILE WATER, USP 99.95% OPTIME
TOPICAL | Status: DC | PRN
  Administered 2024-02-01: 450 mL via TOPICAL

## 2024-02-01 MED ORDER — BUPIVACAINE LIPOSOME 1.3 % IJ SUSP
INTRAMUSCULAR | Status: DC | PRN
  Administered 2024-02-01: 20 mL

## 2024-02-01 MED ORDER — FENTANYL CITRATE (PF) 100 MCG/2ML IJ SOLN
INTRAMUSCULAR | Status: AC
  Filled 2024-02-01: qty 2

## 2024-02-01 MED ORDER — ESCITALOPRAM OXALATE 10 MG PO TABS
20.0000 mg | ORAL_TABLET | Freq: Every day | ORAL | Status: DC
  Administered 2024-02-01: 20 mg via ORAL
  Filled 2024-02-01: qty 2

## 2024-02-01 MED ORDER — SODIUM CHLORIDE 0.9 % IR SOLN
Status: DC | PRN
  Administered 2024-02-01: 3000 mL

## 2024-02-01 MED ORDER — PHENYLEPHRINE HCL-NACL 20-0.9 MG/250ML-% IV SOLN
INTRAVENOUS | Status: DC | PRN
  Administered 2024-02-01: 30 ug/min via INTRAVENOUS

## 2024-02-01 MED ORDER — MIDAZOLAM HCL (PF) 2 MG/2ML IJ SOLN
INTRAMUSCULAR | Status: DC | PRN
  Administered 2024-02-01 (×2): 1 mg via INTRAVENOUS

## 2024-02-01 MED ORDER — OXYCODONE HCL 5 MG PO TABS: 5.0000 mg | ORAL_TABLET | Freq: Once | ORAL | Status: DC | PRN

## 2024-02-01 MED ORDER — BUPIVACAINE HCL 0.25 % IJ SOLN
INTRAMUSCULAR | Status: DC | PRN
  Administered 2024-02-01: 30 mL

## 2024-02-01 MED ORDER — IBUPROFEN 200 MG PO TABS
600.0000 mg | ORAL_TABLET | Freq: Three times a day (TID) | ORAL | Status: DC
  Administered 2024-02-01 – 2024-02-02 (×2): 600 mg via ORAL
  Filled 2024-02-01 (×2): qty 3

## 2024-02-01 MED ORDER — FENTANYL CITRATE (PF) 250 MCG/5ML IJ SOLN
INTRAMUSCULAR | Status: AC
  Filled 2024-02-01: qty 5

## 2024-02-01 MED ORDER — MORPHINE SULFATE (PF) 4 MG/ML IV SOLN
INTRAVENOUS | Status: AC
  Filled 2024-02-01: qty 2

## 2024-02-01 MED ORDER — POVIDONE-IODINE 10 % EX SWAB: 2.0000 | Freq: Once | CUTANEOUS | Status: AC

## 2024-02-01 MED ORDER — BUPIVACAINE-EPINEPHRINE (PF) 0.5% -1:200000 IJ SOLN
INTRAMUSCULAR | Status: DC | PRN
  Administered 2024-02-01: 15 mL via PERINEURAL

## 2024-02-01 MED ORDER — IBUPROFEN 200 MG PO TABS: 600.0000 mg | ORAL_TABLET | Freq: Three times a day (TID) | ORAL | Status: DC | PRN

## 2024-02-01 MED ORDER — ONDANSETRON HCL 4 MG/2ML IJ SOLN
INTRAMUSCULAR | Status: DC | PRN
  Administered 2024-02-01: 4 mg via INTRAVENOUS

## 2024-02-01 MED ORDER — METHOCARBAMOL 500 MG PO TABS
500.0000 mg | ORAL_TABLET | Freq: Four times a day (QID) | ORAL | Status: DC | PRN
  Administered 2024-02-01 – 2024-02-02 (×3): 500 mg via ORAL
  Filled 2024-02-01 (×3): qty 1

## 2024-02-01 MED ORDER — PHENOL 1.4 % MT LIQD: 1.0000 | OROMUCOSAL | Status: DC | PRN

## 2024-02-01 MED ORDER — PROPOFOL 500 MG/50ML IV EMUL
INTRAVENOUS | Status: DC | PRN
  Administered 2024-02-01: 100 ug/kg/min via INTRAVENOUS

## 2024-02-01 MED ORDER — ACETAMINOPHEN 325 MG PO TABS: 325.0000 mg | ORAL_TABLET | Freq: Four times a day (QID) | ORAL | Status: DC | PRN

## 2024-02-01 MED ORDER — LACTATED RINGERS IV SOLN: INTRAVENOUS | Status: DC

## 2024-02-01 MED ORDER — ASPIRIN 81 MG PO CHEW
81.0000 mg | CHEWABLE_TABLET | Freq: Two times a day (BID) | ORAL | Status: DC
  Administered 2024-02-01 – 2024-02-02 (×2): 81 mg via ORAL
  Filled 2024-02-01 (×2): qty 1

## 2024-02-01 MED ORDER — CLONIDINE HCL (ANALGESIA) 100 MCG/ML EP SOLN
EPIDURAL | Status: DC | PRN
  Administered 2024-02-01: .5 mL via INTRA_ARTICULAR

## 2024-02-01 MED ORDER — ACETAMINOPHEN 500 MG PO TABS
1000.0000 mg | ORAL_TABLET | Freq: Four times a day (QID) | ORAL | Status: AC
  Administered 2024-02-01 – 2024-02-02 (×3): 1000 mg via ORAL
  Filled 2024-02-01 (×3): qty 2

## 2024-02-01 MED ORDER — CHLORHEXIDINE GLUCONATE 0.12 % MT SOLN
15.0000 mL | Freq: Once | OROMUCOSAL | Status: AC
  Administered 2024-02-01: 15 mL via OROMUCOSAL
  Filled 2024-02-01: qty 15

## 2024-02-01 MED ORDER — MIDAZOLAM HCL 2 MG/2ML IJ SOLN
INTRAMUSCULAR | Status: AC
  Filled 2024-02-01: qty 2

## 2024-02-01 MED ORDER — BUPIVACAINE IN DEXTROSE 0.75-8.25 % IT SOLN
INTRATHECAL | Status: DC | PRN
  Administered 2024-02-01: 1.7 mL via INTRATHECAL

## 2024-02-01 MED ORDER — METHOCARBAMOL 1000 MG/10ML IJ SOLN: 500.0000 mg | Freq: Four times a day (QID) | INTRAMUSCULAR | Status: DC | PRN

## 2024-02-01 MED ORDER — TRANEXAMIC ACID 1000 MG/10ML IV SOLN
INTRAVENOUS | Status: DC | PRN
  Administered 2024-02-01: 2000 mg via TOPICAL

## 2024-02-01 MED ORDER — METOCLOPRAMIDE HCL 5 MG PO TABS
5.0000 mg | ORAL_TABLET | Freq: Three times a day (TID) | ORAL | Status: DC | PRN
  Filled 2024-02-01: qty 2

## 2024-02-01 MED ORDER — BUPIVACAINE LIPOSOME 1.3 % IJ SUSP
INTRAMUSCULAR | Status: AC
  Filled 2024-02-01: qty 20

## 2024-02-01 MED ORDER — OXYCODONE HCL 5 MG/5ML PO SOLN: 5.0000 mg | Freq: Once | ORAL | Status: DC | PRN

## 2024-02-01 MED ORDER — METOCLOPRAMIDE HCL 5 MG/ML IJ SOLN: 5.0000 mg | Freq: Three times a day (TID) | INTRAMUSCULAR | Status: DC | PRN

## 2024-02-01 MED ORDER — SODIUM CHLORIDE 0.9% FLUSH
INTRAVENOUS | Status: DC | PRN
  Administered 2024-02-01: 20 mL

## 2024-02-01 MED ORDER — VANCOMYCIN HCL 1000 MG IV SOLR
INTRAVENOUS | Status: DC | PRN
  Administered 2024-02-01: 1000 mg via TOPICAL

## 2024-02-01 MED ORDER — 0.9 % SODIUM CHLORIDE (POUR BTL) OPTIME
TOPICAL | Status: DC | PRN
  Administered 2024-02-01: 3000 mL
  Administered 2024-02-01: 1000 mL

## 2024-02-01 NOTE — Anesthesia Procedure Notes (Signed)
 Anesthesia Regional Block: Adductor canal block   Pre-Anesthetic Checklist: , timeout performed,  Correct Patient, Correct Site, Correct Laterality,  Correct Procedure, Correct Position, site marked,  Risks and benefits discussed,  Surgical consent,  Pre-op evaluation,  At surgeon's request and post-op pain management  Laterality: Left and Lower  Prep: chloraprep       Needles:  Injection technique: Single-shot      Needle Length: 9cm  Needle Gauge: 22     Additional Needles: Arrow StimuQuik ECHO Echogenic Stimulating PNB Needle  Procedures:,,,, ultrasound used (permanent image in chart),,    Narrative:  Start time: 02/01/2024 11:02 AM End time: 02/01/2024 11:07 AM Injection made incrementally with aspirations every 5 mL.  Performed by: Personally  Anesthesiologist: Leopoldo Bruckner, MD

## 2024-02-01 NOTE — Plan of Care (Signed)
   Problem: Education: Goal: Knowledge of General Education information will improve Description Including pain rating scale, medication(s)/side effects and non-pharmacologic comfort measures Outcome: Progressing   Problem: Health Behavior/Discharge Planning: Goal: Ability to manage health-related needs will improve Outcome: Progressing

## 2024-02-01 NOTE — Progress Notes (Signed)
 Orthopedic Tech Progress Note Patient Details:  [name] July 27, 1955 [id number]  CPM Left Knee CPM Left Knee: On Left Knee Flexion (Degrees): 10 Left Knee Extension (Degrees): 40  Post Interventions Patient Tolerated: Well Instructions Provided: Care of device, Adjustment of device Ortho Devices Type of Ortho Device: Bone foam zero knee, CPM padding Ortho Device/Splint Location: Bone Foam at Bedside Ortho Device/Splint Interventions: Ordered   Post Interventions Patient Tolerated: Well Instructions Provided: Care of device, Adjustment of device  [name] F Vencil Basnett 02/01/2024, 11:24 AM

## 2024-02-01 NOTE — Anesthesia Postprocedure Evaluation (Signed)
"   Anesthesia Post Note  Patient: [name]  Procedure(s) Performed: ARTHROPLASTY, KNEE, TOTAL, LEFT (Left: Knee)     Patient location during evaluation: PACU Anesthesia Type: Regional, MAC and Spinal Level of consciousness: awake and alert Pain management: pain level controlled Vital Signs Assessment: post-procedure vital signs reviewed and stable Respiratory status: spontaneous breathing, nonlabored ventilation, respiratory function stable and patient connected to nasal cannula oxygen Cardiovascular status: stable and blood pressure returned to baseline Postop Assessment: no apparent nausea or vomiting Anesthetic complications: no   No notable events documented.  Last Vitals:  Vitals:   02/01/24 1145 02/01/24 1200  BP: (!) 102/50 108/60  Pulse: 70 67  Resp: 16 14  Temp:    SpO2: 99% 99%    Last Pain:  Vitals:   02/01/24 1200  TempSrc:   PainSc: 3             L Sensory Level: S1-Sole of foot, small toes (02/01/24 1200) R Sensory Level: S1-Sole of foot, small toes (02/01/24 1200)  Lynann Demetrius      "

## 2024-02-01 NOTE — H&P (Signed)
 TOTAL KNEE ADMISSION H&P  Patient is being admitted for left total knee arthroplasty.  Subjective:  Chief Complaint:left knee pain.  HPI: [name], 69 y.o. female, has a history of pain and functional disability in the left knee due to arthritis and has failed non-surgical conservative treatments for greater than 12 weeks to includeNSAID's and/or analgesics, corticosteriod injections, viscosupplementation injections, and activity modification.  Onset of symptoms was gradual, starting 8 years ago with gradually worsening course since that time. The patient noted no past surgery on the left knee(s).  Patient currently rates pain in the left knee(s) at 8 out of 10 with activity. Patient has night pain, worsening of pain with activity and weight bearing, pain that interferes with activities of daily living, pain with passive range of motion, crepitus, and joint swelling.  Patient has evidence of subchondral sclerosis and joint space narrowing by imaging studies. This patient has had a long course of non op treatment and no h/o dvt or pe with no active infection.   [name] is a 69 y.o. female who presents to the office reporting left knee pain of several years duration.  Been severe over the past year.  Has tried injections including cortisone and gel and Zilretta  with minimal relief.  Has had an MRI scan done in June of this year which demonstrates end-stage arthritis predominantly in the medial compartment with fairly significant bone edema in both the medial femoral condyle and medial tibial plateau.  Some patellofemoral arthritis is also present.  Patient describes some popping and stiffness and swelling with weakness.  She also states she feels the Baker's cyst that she has in the back of her knee which is uncomfortable at times.  No prior knee surgery.  She ambulates with a limp.  She decreases her activity because of the knee limits her in many ways.  Tylenol  will only a little bit.  She is  retired.  Has a daughter who is 10 minutes away.  Several good friends for social support.  She has 1 city block of walking endurance before pain alters her gait and ability to get around.  She goes up steps 1 at a time.  Does have a known history of osteopenia but she takes vitamin D .  She lives on a condo with no stairs.  No personal history or family history of DVT or pulmonary embolism.  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/21/2022   Healthcare maintenance 05/29/2022   Advance care planning 05/29/2022   Anxiety 05/29/2022   HLD (hyperlipidemia) 05/29/2022   Joint pain 06/24/2021   Past Medical History:  Diagnosis Date   ADHD (attention deficit hyperactivity disorder)    Allergy    Seasonal allergies   Anxiety    Arthritis 2010   In hands and knee   Depression 2005   History of asthma    as a child grew out of it   History of pneumonia as a child    Hyperlipidemia     Past Surgical History:  Procedure Laterality Date   APPENDECTOMY     HEEL SPUR SURGERY Bilateral     Current Facility-Administered Medications  Medication Dose Route Frequency Provider Last Rate Last Admin   ceFAZolin  (ANCEF ) IVPB 2g/100 mL premix  2 g Intravenous On Call to OR Magnant, Charles L, PA-C       lactated ringers  infusion   Intravenous  Continuous Treen, Jon R, MD       povidone-iodine  (BETADINE ) 7.5 % scrub   Topical Once Magnant, Carlin CROME, PA-C       tranexamic acid  (CYKLOKAPRON ) 2,000 mg in sodium chloride  0.9 % 50 mL Topical Application  2,000 mg Topical To OR Addie, Cordella Hamilton, MD       tranexamic acid  (CYKLOKAPRON ) IVPB 1,000 mg  1,000 mg Intravenous To OR Magnant, Charles L, PA-C       Allergies[1]  Social History   Tobacco Use   Smoking status: Every Day    Current packs/day: 0.50    Average packs/day: 0.5 packs/day for 40.0 years (20.0 ttl pk-yrs)    Types: Cigarettes   Smokeless  tobacco: Never  Substance Use Topics   Alcohol use: Not Currently    Alcohol/week: 1.0 standard drink of alcohol    Types: 1 Standard drinks or equivalent per week    Comment: Drink in social situations    Family History  Problem Relation Age of Onset   Stroke Mother    Anxiety disorder Mother    Depression Mother    Hypertension Mother    Pulmonary fibrosis Father    Heart disease Father    Ulcerative colitis Father    Arthritis Sister    Hypertension Sister    Arthritis Maternal Grandmother    Alcohol abuse Maternal Grandfather    Arthritis Paternal Grandmother    ADD / ADHD Daughter    Cancer Maternal Uncle    Alcohol abuse Paternal Uncle    Vision loss Paternal Uncle    Colon cancer Neg Hx    Breast cancer Neg Hx      Review of Systems  Musculoskeletal:  Positive for arthralgias.  All other systems reviewed and are negative.   Objective:  Physical Exam Vitals reviewed.  HENT:     Head: Normocephalic.     Nose: Nose normal.     Mouth/Throat:     Mouth: Mucous membranes are moist.  Cardiovascular:     Rate and Rhythm: Normal rate.     Pulses: Normal pulses.  Pulmonary:     Effort: Pulmonary effort is normal.  Abdominal:     General: Abdomen is flat.  Musculoskeletal:     Cervical back: Normal range of motion.  Skin:    General: Skin is warm.     Capillary Refill: Capillary refill takes less than 2 seconds.  Neurological:     General: No focal deficit present.     Mental Status: She is alert.  Psychiatric:        Mood and Affect: Mood normal.   Examination of the left knee demonstrates mild varus deformity. Medial greater than lateral joint line tenderness. Collateral cruciate ligaments are stable. Pedal pulses palpable. Range of motion is about 0-1 20. No groin pain with internal or external rotation of the leg. No other masses lymphadenopathy or skin changes noted in that left knee region but she does have a palpable Baker's cyst. Minimal effusion in  the knee itself.   Vital signs in last 24 hours: Temp:  [97.8 F (36.6 C)] 97.8 F (36.6 C) (01/22 0603) Pulse Rate:  [72] 72 (01/22 0603) Resp:  [17] 17 (01/22 0603) BP: (130)/(75) 130/75 (01/22 0603) SpO2:  [98 %] 98 % (01/22 0603) Weight:  [63.3 kg] 63.3 kg (01/22 0603)  Labs:   Estimated body mass index is 25.11 kg/m as calculated from the following:   Height as of this encounter: 5' 2.5 (  1.588 m).   Weight as of this encounter: 63.3 kg.   Imaging Review Plain radiographs demonstrate severe degenerative joint disease of the left knee(s). The overall alignment ismild varus. Also seen on mri scanning  The bone quality appears to be good for age and reported activity level.      Assessment/Plan:  End stage arthritis, left knee   The patient history, physical examination, clinical judgment of the provider and imaging studies are consistent with end stage degenerative joint disease of the left knee(s) and total knee arthroplasty is deemed medically necessary. The treatment options including medical management, injection therapy arthroscopy and arthroplasty were discussed at length. The risks and benefits of total knee arthroplasty were presented and reviewed. The risks due to aseptic loosening, infection, stiffness, patella tracking problems, thromboembolic complications and other imponderables were discussed. The patient acknowledged the explanation, agreed to proceed with the plan and consent was signed. Patient is being admitted for inpatient treatment for surgery, pain control, PT, OT, prophylactic antibiotics, VTE prophylaxis, progressive ambulation and ADL's and discharge planning. The patient is planning to be discharged home with home health services  mpression is left knee pain with significant medial compartment arthritis and fair amount of edema in the medial femoral condyle and medial tibial plateau. Patellofemoral arthritis is also present. Meniscus extruded. Nothing  arthroscopically treatable in this knee which is going to give her predictable pain relief. Best option for Terresa would be cemented total knee replacement. Risks and benefits of that intervention are discussed with the patient including not limited to infection or vessel damage incomplete pain relief as well as incomplete restoration of function. The grueling nature of the rehabilitative process was also discussed. Patient understands and feels like her best option moving forward based on failure of conservative treatment would be knee replacement. I agree with her assessment. Plan at this time to post knee replacement for sometime after Christmas. Ultram  prescribed in the meantime for pain relief. Continue with nonweightbearing quad strengthening exercises. All questions answered.    Patient's anticipated LOS is less than 2 midnights, meeting these requirements: - Younger than 29 - Lives within 1 hour of care - Has a competent adult at home to recover with post-op recover - NO history of  - Chronic pain requiring opiods  - Diabetes  - Coronary Artery Disease  - Heart failure  - Heart attack  - Stroke  - DVT/VTE  - Cardiac arrhythmia  - Respiratory Failure/COPD  - Renal failure  - Anemia  - Advanced Liver disease      [1]  Allergies Allergen Reactions   Adderall Xr [Amphetamine-Dextroamphet Er]     No benefit with use   Pravastatin Sodium     Other reaction(s): leg cramps   Strattera [Atomoxetine]     Intolerant.    Zilretta  [Triamcinolone  Acetonide]     Mood changes.

## 2024-02-01 NOTE — Transfer of Care (Signed)
 Immediate Anesthesia Transfer of Care Note  Patient: [name]  Procedure(s) Performed: ARTHROPLASTY, KNEE, TOTAL, LEFT (Left: Knee)  Patient Location: PACU  Anesthesia Type:Spinal and MAC combined with regional for post-op pain  Level of Consciousness: awake, alert , and oriented  Airway & Oxygen Therapy: Patient Spontanous Breathing and Patient connected to nasal cannula oxygen  Post-op Assessment: Report given to RN, Post -op Vital signs reviewed and stable, Patient moving all extremities X 4, and Patient able to stick tongue midline  Post vital signs: Reviewed and stable  Last Vitals:  Vitals Value Taken Time  BP 98/69 02/01/24 10:27  Temp 97.8   Pulse 77 02/01/24 10:28  Resp 19 02/01/24 10:28  SpO2 95 % 02/01/24 10:28  Vitals shown include unfiled device data.  Last Pain:  Vitals:   02/01/24 0611  TempSrc:   PainSc: 0-No pain         Complications: No notable events documented.

## 2024-02-01 NOTE — Brief Op Note (Signed)
" ° °  02/01/2024  11:11 AM  PATIENT:  [name]  69 y.o. female  PRE-OPERATIVE DIAGNOSIS:  Left Knee Osteoarthritis  POST-OPERATIVE DIAGNOSIS:  Left Knee Osteoarthritis  PROCEDURE:  Procedures: ARTHROPLASTY, KNEE, TOTAL, LEFT  SURGEON:  Surgeon(s): Addie, Cordella Hamilton, MD  ASSISTANT: Magnant PA  ANESTHESIA:   Spinal  EBL: 50 ml    Total I/O In: 800 [I.V.:600; IV Piggyback:200] Out: 510 [Urine:500; Blood:10]  BLOOD ADMINISTERED: none  DRAINS: None  LOCAL MEDICATIONS USED: Marcaine  morphine  clonidine  Exparel  vancomycin  powder  SPECIMEN:  No Specimen  COUNTS:  YES  TOURNIQUET:   Total Tourniquet Time Documented: Thigh (Left) - 84 minutes Total: Thigh (Left) - 84 minutes   DICTATION: .Other Dictation: Dictation Number [id number]  PLAN OF CARE: Admit for overnight observation  PATIENT DISPOSITION:  PACU - hemodynamically stable              "

## 2024-02-01 NOTE — Anesthesia Procedure Notes (Signed)
 Spinal  Patient location during procedure: OR Start time: 02/01/2024 7:39 AM End time: 02/01/2024 7:44 AM Reason for block: surgical anesthesia  Staffing Performed: anesthesiologist  Authorized by: Leopoldo Bruckner, MD   Performed by: Leopoldo Bruckner, MD  Preanesthetic Checklist Completed: patient identified, IV checked, risks and benefits discussed, surgical consent, monitors and equipment checked, pre-op evaluation and timeout performed Spinal Block Patient position: sitting Prep: ChloraPrep Patient monitoring: heart rate, cardiac monitor, continuous pulse ox and blood pressure Approach: midline Location: L4-5 Injection technique: single-shot Needle Needle type: Pencan  Needle gauge: 24 G Needle length: 9 cm Assessment Events: CSF return

## 2024-02-01 NOTE — Anesthesia Preprocedure Evaluation (Signed)
 "                                  Anesthesia Evaluation  Patient identified by MRN, date of birth, ID band Patient awake    Reviewed: Allergy & Precautions, NPO status , Patient's Chart, lab work & pertinent test results  History of Anesthesia Complications Negative for: history of anesthetic complications  Airway Mallampati: III  TM Distance: >3 FB Neck ROM: Full    Dental  (+) Teeth Intact, Dental Advisory Given   Pulmonary neg shortness of breath, neg sleep apnea, neg COPD, neg recent URI, Current Smoker and Patient abstained from smoking.   breath sounds clear to auscultation       Cardiovascular negative cardio ROS  Rhythm:Regular     Neuro/Psych neg Seizures PSYCHIATRIC DISORDERS Anxiety Depression       GI/Hepatic negative GI ROS, Neg liver ROS,,,  Endo/Other    Renal/GU negative Renal ROS     Musculoskeletal  (+) Arthritis ,    Abdominal   Peds  Hematology negative hematology ROS (+) Lab Results      Component                Value               Date                      WBC                      6.2                 01/25/2024                HGB                      14.1                01/25/2024                HCT                      41.5                01/25/2024                MCV                      93.0                01/25/2024                PLT                      257                 01/25/2024              Anesthesia Other Findings   Reproductive/Obstetrics                              Anesthesia Physical Anesthesia Plan  ASA: 2  Anesthesia Plan: MAC, Spinal and Regional   Post-op Pain Management:    Induction: Intravenous  PONV Risk Score and Plan: 2 and Ondansetron  and Propofol  infusion  Airway Management  Planned: Nasal Cannula, Natural Airway and Simple Face Mask  Additional Equipment: None  Intra-op Plan:   Post-operative Plan:   Informed Consent: I have reviewed the patients  History and Physical, chart, labs and discussed the procedure including the risks, benefits and alternatives for the proposed anesthesia with the patient or authorized representative who has indicated his/her understanding and acceptance.     Dental advisory given  Plan Discussed with: CRNA  Anesthesia Plan Comments:         Anesthesia Quick Evaluation  "

## 2024-02-01 NOTE — Anesthesia Procedure Notes (Signed)
 Procedure Name: MAC Date/Time: 02/01/2024 7:41 AM  Performed by: Harrold Macintosh, CRNAPre-anesthesia Checklist: Patient identified, Emergency Drugs available, Suction available, Patient being monitored and Timeout performed Patient Re-evaluated:Patient Re-evaluated prior to induction Oxygen Delivery Method: Nasal cannula Induction Type: IV induction Placement Confirmation: positive ETCO2 Dental Injury: Teeth and Oropharynx as per pre-operative assessment

## 2024-02-01 NOTE — Op Note (Signed)
 [name], [name] MEDICAL RECORD NO: [id number] ACCOUNT NO: [id number] DATE OF BIRTH: 07-17-1955 FACILITY: [facility] LOCATION: [facility] PHYSICIAN: Cordella RAMAN. Addie, MD  Operative Report   DATE OF PROCEDURE: 02/01/2024  PREOPERATIVE DIAGNOSIS:  Left knee arthritis.  POSTOPERATIVE DIAGNOSIS:  Left knee arthritis.  PROCEDURE:  Cemented left total knee replacement using Persona size 7 narrow femur with 32 mm cemented all-poly patella, 13 mm medial congruent polyethylene liner, and size D keeled cemented tibia.  SURGEON:  Cordella RAMAN. Addie, MD.  ASSISTANT:  Herlene Calix, PA.  INDICATIONS:  The patient is a 69 year old patient with end-stage left knee arthritis who presents for operative management after explanation of risks and benefits.  DESCRIPTION OF PROCEDURE:  The patient was brought to the operating room where spinal anesthetic was administered.  Timeout was called.  Left leg was prescrubbed with alcohol and Betadine , allowed to air dry, prepped with DuraPrep solution, and draped in  a sterile manner.  The patient had about 2-3 degrees of hyperextension and good flexion with good stability to varus and valgus stress.  Slight varus alignment was present.  After sterile prepping and draping, the timeout was called.  The leg was  elevated, exsanguinated with the Esmarch wrap.  The tourniquet was inflated.  Ioban had been used to cover the operative field.  Anterior approach to the knee was made.  IrriSept solution was utilized.  A median parapatellar approach was then made and  marked with a #1 Vicryl suture.  IrriSept solution was used in the joint.  The patella was everted, fat pad partially excised, and the lateral patellofemoral ligament was released.  Soft tissue was removed from the anterior distal femur.  Medial soft  tissue dissection was performed proportionate to the patient's mild varus deformity.  Severe end-stage arthritis was present both in the medial compartment and patellofemoral  compartment.  Anterior horn lateral meniscus released.  ACL released.   Posterior retractor placed.  Osteophytes removed.  Intramedullary alignment was used to make a cut perpendicular to the mechanical axis, which ended up being 2 mm off the most affected medial tibial plateau and 11-12 off the least affected lateral tibial  plateau.  Collaterals were protected during the tibial plateau cut.  Bone quality was good, but not quite good enough for press-fit implant.  Intramedullary alignment was then used to make a cut 5 degrees valgus on the femur.  A 9 mm resection was made.   This allowed a 10 and 12 spacer to fit in the knee with full extension.  The knee was then sized to a size 7 narrow femur.  We did choose between 7 and 8.  The size 7 gave about a 1-2 mm of femoral notch, but the size 8 was too big in the  medial-lateral plane.  Even 8 narrow would be too wide.  For that reason, we chose the 7 narrow.  The 3 degrees of external rotation gave symmetric flexion and extension gaps.  The anterior, posterior, and chamfer cuts were made.  The tibial tray was  then placed and aligned with the medial third of the tibial tubercle.  With the tray in position and the femur in position, we did trial reduction with the 10 and 12 spacer.  The size 12 spacer gave about 3 degrees of hyperextension.  The patella was  then cut down from 22 to 13 mm.  A patellar button was placed.  With trial components in position, the knee had full extension, full flexion, no  liftoff, and excellent patellar tracking.  We did use a 13 spacer because of slightly more hyperextension  than the patient's native 2 degrees of hyperextension.  This allowed the knee to be straight but without excessive hyperextension.  Final preparations then made on both the femur and the tibia.  At this time, the trial components were removed.  Thorough irrigation was performed with 3 liters of irrigating solution.  We then numbed up the capsule using  Marcaine , Exparel  and saline.   We then placed the tranexamic acid  sponge into the knee and then also placed IrriSept in the knee for 3 minutes.  These were removed.  Bone plugs were placed in the femur and tibia.  We also placed vancomycin  after IrriSept solution was placed and  suctioned out into the tibia.  On the dried surfaces, cement was placed, and we placed the implants with excess cement removed.  We allowed cement to harden with the 13 mm trial spacer, which gave full extension without hyperextension.  Excess cement was  removed.  With the 13 spacer in, the patient had good extension and excellent flexion and excellent patellar tracking using no-thumbs technique.  The true 13 mm spacer was placed.  The patient had good stability to varus and valgus stress at 0, 30, and  90 degrees.  The tourniquet was released at this time.  Bleeding points encountered were controlled using electrocautery.  Thorough irrigation x3 liters was performed.  The arthrotomy was then closed over a bolster using #1 Vicryl suture.  We used  IrriSept solution again prior to final arthrotomy closure.  We then placed vancomycin  powder into the knee and then closed up the arthrotomy completely, then injected the knee with Marcaine , morphine , and clonidine .  At this time, the skin was closed  using 0 Vicryl suture, 2-0 Vicryl suture, and 3-0 Monocryl with Steri-Strips, Aquacel dressing, Ace wrap and knee immobilizer placed.    Luke's assistance was required at all times for retraction, opening, closing, and mobilization of tissue.  His assistance was of medical necessity.   CHR D: 02/01/2024 11:18:33 am T: 02/01/2024 11:43:00 am  JOB: [id number]/ [id number]

## 2024-02-01 NOTE — Evaluation (Signed)
 Physical Therapy Evaluation Patient Details Name: [name] MRN: [id number] DOB: July 01, 1955 Today's Date: 02/01/2024  History of Present Illness  69 y.o. female presents to [facility] 02/01/24 for elective L TKA. PMHx: arthritis, anxiety/depression  Clinical Impression  PTA pt was independent for mobility with no AD. Pt presents with deficits consistent with L TKA including decreased ROM, pain, decreased strength, and impaired balance. Pt was supervision for bed mobility and CGA/supervision to stand and ambulate 68ft with RW. Progressed from step to gait pattern to step through pattern with increased distance. Able to perform LE HEP with education provided on rep scheme. Also discussed progressive walking program and keeping knee in extension when in supine. Pt has intermittent assist available upon d/c home. Will continue to follow acutely.         If plan is discharge home, recommend the following: Assist for transportation;Help with stairs or ramp for entrance;A little help with bathing/dressing/bathroom;A little help with walking and/or transfers   Can travel by private vehicle    Yes    Equipment Recommendations None recommended by PT     Functional Status Assessment Patient has had a recent decline in their functional status and demonstrates the ability to make significant improvements in function in a reasonable and predictable amount of time.     Precautions / Restrictions Precautions Precautions: Fall Recall of Precautions/Restrictions: Intact Restrictions Weight Bearing Restrictions Per Provider Order: Yes LLE Weight Bearing Per Provider Order: Weight bearing as tolerated Other Position/Activity Restrictions: In KI      Mobility  Bed Mobility Overal bed mobility: Needs Assistance Bed Mobility: Supine to Sit, Sit to Supine    Supine to sit: Supervision, HOB elevated Sit to supine: Supervision, HOB elevated   General bed mobility comments: exited to the left with  supervision for safety    Transfers Overall transfer level: Needs assistance Equipment used: Rolling walker (2 wheels) Transfers: Sit to/from Stand Sit to Stand: Supervision, Contact guard assist     General transfer comment: cues for hand placement with CGA for initial stand, progressed to supervision for safety    Ambulation/Gait Ambulation/Gait assistance: Contact guard assist Gait Distance (Feet): 30 Feet Assistive device: Rolling walker (2 wheels) Gait Pattern/deviations: Step-to pattern, Step-through pattern, Decreased step length - left, Decreased stance time - right Gait velocity: decr    General Gait Details: initial step to pattern with LLE leading, progressed to step through with increased distance    Balance Overall balance assessment: Needs assistance Sitting-balance support: No upper extremity supported, Feet supported Sitting balance-Leahy Scale: Fair     Standing balance support: Bilateral upper extremity supported, During functional activity, Reliant on assistive device for balance Standing balance-Leahy Scale: Poor Standing balance comment: reliant on UE support       Pertinent Vitals/Pain Pain Assessment Pain Assessment: Faces Faces Pain Scale: Hurts whole lot Pain Location: L knee Pain Descriptors / Indicators: Discomfort, Guarding, Grimacing Pain Intervention(s): Limited activity within patient's tolerance, Monitored during session, Premedicated before session, Repositioned    Home Living Family/patient expects to be discharged to:: Private residence (Home with PT) Living Arrangements: Alone Available Help at Discharge: Family;Available PRN/intermittently Type of Home: House (condo) Home Access: Stairs to enter;Ramped entrance   Entrance Stairs-Number of Steps: 5   Home Layout: One level Home Equipment: Teacher, English As A Foreign Language (2 wheels);Cane - single point;Grab bars - toilet;Grab bars - tub/shower      Prior Function Prior Level of Function  : Independent/Modified Independent;Driving    Mobility Comments: ModI with no AD  ADLs Comments: Ind     Extremity/Trunk Assessment   Upper Extremity Assessment Upper Extremity Assessment: Overall WFL for tasks assessed    Lower Extremity Assessment Lower Extremity Assessment: LLE deficits/detail LLE Deficits / Details: s/p L TKA, good quad activation with no evidence of quad lag with SLR LLE Sensation: WNL    Cervical / Trunk Assessment Cervical / Trunk Assessment: Normal  Communication   Communication Communication: No apparent difficulties    Cognition Arousal: Alert Behavior During Therapy: WFL for tasks assessed/performed   PT - Cognitive impairments: No apparent impairments      Following commands: Intact       Cueing Cueing Techniques: Verbal cues        Exercises Total Joint Exercises Ankle Circles/Pumps: AROM, Both, 10 reps, Supine Hip ABduction/ADduction: AROM, Left, 5 reps, Supine Straight Leg Raises: AROM, Left, 5 reps, Supine Long Arc Quad: AROM, Left, 5 reps, Seated Knee Flexion: AROM, Left, 5 reps, Seated Goniometric ROM: 25-80   Assessment/Plan    PT Assessment Patient needs continued PT services  PT Problem List Decreased strength;Decreased range of motion;Decreased activity tolerance;Decreased balance;Decreased mobility       PT Treatment Interventions DME instruction;Gait training;Stair training;Functional mobility training;Therapeutic activities;Neuromuscular re-education;Therapeutic exercise;Balance training;Patient/family education    PT Goals (Current goals can be found in the Care Plan section)  Acute Rehab PT Goals Patient Stated Goal: to get better PT Goal Formulation: With patient/family Time For Goal Achievement: 02/15/24 Potential to Achieve Goals: Good    Frequency 7X/week        AM-PAC PT 6 Clicks Mobility  Outcome Measure Help needed turning from your back to your side while in a flat bed without using bedrails?:  None Help needed moving from lying on your back to sitting on the side of a flat bed without using bedrails?: A Little Help needed moving to and from a bed to a chair (including a wheelchair)?: A Little Help needed standing up from a chair using your arms (e.g., wheelchair or bedside chair)?: A Little Help needed to walk in hospital room?: A Little Help needed climbing 3-5 steps with a railing? : A Little 6 Click Score: 19    End of Session Equipment Utilized During Treatment: Gait belt Activity Tolerance: Patient tolerated treatment well Patient left: in bed;with call bell/phone within reach;Other (comment) (RN for IV) Nurse Communication: Mobility status PT Visit Diagnosis: Other abnormalities of gait and mobility (R26.89);Muscle weakness (generalized) (M62.81)    Time: 8464-8394 PT Time Calculation (min) (ACUTE ONLY): 30 min   Charges:   PT Evaluation $PT Eval Low Complexity: 1 Low PT Treatments $Therapeutic Exercise: 8-22 mins PT General Charges $$ ACUTE PT VISIT: 1 Visit        Kate ORN, PT, DPT Secure Chat Preferred  Rehab Office [phone]   Kate BRAVO Wendolyn 02/01/2024, 4:53 PM

## 2024-02-01 NOTE — Progress Notes (Signed)
 Patient arrived to room. Alert and oriented x4, VSS. CPM on. Call bell within reach and bed alarm on. Pain addressed.

## 2024-02-02 ENCOUNTER — Encounter (HOSPITAL_COMMUNITY): Payer: Self-pay | Admitting: Orthopedic Surgery

## 2024-02-02 ENCOUNTER — Other Ambulatory Visit (HOSPITAL_COMMUNITY): Payer: Self-pay

## 2024-02-02 DIAGNOSIS — M1712 Unilateral primary osteoarthritis, left knee: Secondary | ICD-10-CM | POA: Diagnosis not present

## 2024-02-02 MED ORDER — DOCUSATE SODIUM 100 MG PO CAPS
100.0000 mg | ORAL_CAPSULE | Freq: Two times a day (BID) | ORAL | 0 refills | Status: AC
  Filled 2024-02-02: qty 10, 5d supply, fill #0

## 2024-02-02 MED ORDER — ACETAMINOPHEN 325 MG PO TABS
325.0000 mg | ORAL_TABLET | Freq: Four times a day (QID) | ORAL | 0 refills | Status: AC | PRN
  Filled 2024-02-02: qty 60, 8d supply, fill #0

## 2024-02-02 MED ORDER — OXYCODONE HCL 5 MG PO TABS
5.0000 mg | ORAL_TABLET | ORAL | 0 refills | Status: DC | PRN
  Filled 2024-02-02: qty 30, 5d supply, fill #0

## 2024-02-02 MED ORDER — ASPIRIN 81 MG PO CHEW
81.0000 mg | CHEWABLE_TABLET | Freq: Two times a day (BID) | ORAL | 0 refills | Status: DC
  Filled 2024-02-02: qty 60, 30d supply, fill #0

## 2024-02-02 MED ORDER — METHOCARBAMOL 500 MG PO TABS
500.0000 mg | ORAL_TABLET | Freq: Three times a day (TID) | ORAL | 0 refills | Status: DC | PRN
  Filled 2024-02-02: qty 30, 10d supply, fill #0

## 2024-02-02 NOTE — Progress Notes (Signed)
 DISCHARGE NOTE HOME [name] to be discharged Home per MD order. Discussed prescriptions and follow up appointments with the patient. Prescriptions given to patient; medication list explained in detail. Patient verbalized understanding.  Skin clean, dry and intact without evidence of skin break down, no evidence of skin tears noted. IV catheter discontinued intact. Site without signs and symptoms of complications. Dressing and pressure applied. Pt denies pain at the site currently. No complaints noted.  Patient free of lines, drains, and wounds.   An After Visit Summary (AVS) was printed and given to the patient. Patient escorted via wheelchair, and discharged home via private auto.  Nhung Danko K [name], RN

## 2024-02-02 NOTE — Progress Notes (Signed)
" °  Subjective: [name] is a 69 y.o. female s/p left TKA.  They are POD 1.  Pt's pain is controlled.  Pt denies any complain of chest pain, shortness of breath, abdominal pain, calf pain.  Patient denies any fevers or chills.  No complaints aside from soreness in the back of the knee.  Objective: Vital signs in last 24 hours: Temp:  [97.7 F (36.5 C)-99.1 F (37.3 C)] 99.1 F (37.3 C) (01/23 0800) Pulse Rate:  [61-74] 67 (01/23 0800) Resp:  [12-21] 16 (01/23 0800) BP: (95-133)/(56-81) 99/58 (01/23 0800) SpO2:  [95 %-100 %] 96 % (01/23 0800)  Intake/Output from previous day: 01/22 0701 - 01/23 0700 In: 966.3 [P.O.:120; I.V.:600.4; IV Piggyback:245.8] Out: 510 [Urine:500; Blood:10] Intake/Output this shift: Total I/O In: 120 [P.O.:120] Out: -   Exam:  No gross blood or drainage overlying the dressing.  Small area of erythema to the lateral aspect of the incision.  Improves with elevation. 2+ DP pulse Sensation intact distally in the operative foot Able to dorsiflex and plantarflex the operative foot with 5/5 strength No calf tenderness.  Negative Homans' sign. Able to perform straight leg raise without extensor lag   Labs: No results for input(s): HGB in the last 72 hours. No results for input(s): WBC, RBC, HCT, PLT in the last 72 hours. No results for input(s): NA, K, CL, CO2, BUN, CREATININE, GLUCOSE, CALCIUM in the last 72 hours. No results for input(s): LABPT, INR in the last 72 hours.  Assessment/Plan: Pt is POD 1 s/p TKA.    -Plan to discharge to home today with transition of care pharmacy  -WBAT with a walker  -Follow-up with Dr. Addie in clinic 2 weeks postoperatively    [facility] 02/02/2024, 11:52 AM       "

## 2024-02-02 NOTE — Progress Notes (Signed)
 Mobility Specialist: Progress Note   02/02/24 1200  Mobility  Activity Ambulated with assistance  Level of Assistance Standby assist, set-up cues, supervision of patient - no hands on  Assistive Device Front wheel walker  Distance Ambulated (ft) 150 ft  LLE Weight Bearing Per Provider Order WBAT  Activity Response Tolerated well  Mobility Referral Yes  Mobility visit 1 Mobility  Mobility Specialist Start Time (ACUTE ONLY) 1205  Mobility Specialist Stop Time (ACUTE ONLY) 1215  Mobility Specialist Time Calculation (min) (ACUTE ONLY) 10 min    Pt received in bed, pleasant and agreeable to mobility session. Daughter present. SV throughout. C/o LLE and back pain but tolerated session very well. No physical assist needed. Returned to room. Left in bed with all needs met, call bell in reach.   Ileana Lute Mobility Specialist Please contact via SecureChat or Rehab office at [phone]

## 2024-02-02 NOTE — Care Management Obs Status (Signed)
 MEDICARE OBSERVATION STATUS NOTIFICATION   Patient Details  Name: [name] MRN: [id number] Date of Birth: 08-12-1955   Medicare Observation Status Notification Given:  Yes    Jennie Laneta Dragon 02/02/2024, 1:03 PM

## 2024-02-02 NOTE — Progress Notes (Signed)
 Physical Therapy Treatment Patient Details Name: [name] MRN: [id number] DOB: 19-Aug-1955 Today's Date: 02/02/2024   History of Present Illness 69 y.o. female presents to [facility] 02/01/24 for elective L TKA. PMHx: arthritis, anxiety/depression    PT Comments  Pt demonstrating good progress this session, at mod I to supervision level of assistance for all mobility tasks. Pt able to progress distance ambulated with AD and no physical assistance, demonstrating good knee flexion during swing and improved extension with heel strike and weight acceptance. Pt was educated on the importance of frequent mobilization, once per hour while awake and to progress distance ambulated each day. Pt also educated on the importance of modalities following mobility to reduce swelling and inflammation. PT will continue to treat pt while she is admitted.     If plan is discharge home, recommend the following: Assist for transportation;Help with stairs or ramp for entrance;A little help with bathing/dressing/bathroom;A little help with walking and/or transfers   Can travel by private vehicle        Equipment Recommendations  None recommended by PT    Recommendations for Other Services       Precautions / Restrictions Precautions Precautions: Fall Recall of Precautions/Restrictions: Intact Restrictions Weight Bearing Restrictions Per Provider Order: Yes LLE Weight Bearing Per Provider Order: Weight bearing as tolerated Other Position/Activity Restrictions: In KI     Mobility  Bed Mobility Overal bed mobility: Modified Independent             General bed mobility comments: increased time to complete    Transfers Overall transfer level: Modified independent Equipment used: Rolling walker (2 wheels) Transfers: Sit to/from Stand Sit to Stand: Supervision           General transfer comment: STS from EOB with RW and  no physical assistance. Pt keeps LLE slightly extended to encourage greater WB  through RLE.    Ambulation/Gait Ambulation/Gait assistance: Supervision Gait Distance (Feet): 200 Feet Assistive device: Rolling walker (2 wheels) Gait Pattern/deviations: Step-through pattern, Decreased step length - right, Decreased stance time - left, Decreased dorsiflexion - left, Antalgic Gait velocity: decr Gait velocity interpretation: <1.8 ft/sec, indicate of risk for recurrent falls   General Gait Details: Pt able to progress to reciprocal gait pattern with increased knee flexion during swing phase and demonstrating more of a heel strike with cueing from therapist. Pt's gait speed slowly increases as pt progresses distance.   Stairs             Wheelchair Mobility     Tilt Bed    Modified Rankin (Stroke Patients Only)       Balance Overall balance assessment: Needs assistance Sitting-balance support: No upper extremity supported, Feet supported Sitting balance-Leahy Scale: Good Sitting balance - Comments: seated EOB   Standing balance support: Bilateral upper extremity supported, During functional activity, Reliant on assistive device for balance Standing balance-Leahy Scale: Poor Standing balance comment: reliant on UE support                            Communication Communication Communication: No apparent difficulties  Cognition Arousal: Alert Behavior During Therapy: WFL for tasks assessed/performed   PT - Cognitive impairments: No apparent impairments                         Following commands: Intact      Cueing Cueing Techniques: Verbal cues  Exercises Total Joint Exercises Ankle Circles/Pumps:  AROM, 10 reps, Left Quad Sets: AROM, 10 reps, Left Heel Slides: AROM, Left Straight Leg Raises: AROM, Left, 10 reps Goniometric ROM: 3-85    General Comments        Pertinent Vitals/Pain Pain Assessment Pain Assessment: Faces Faces Pain Scale: Hurts little more Pain Location: L knee Pain Descriptors / Indicators:  Discomfort, Guarding, Grimacing Pain Intervention(s): Limited activity within patient's tolerance, Monitored during session, Premedicated before session    Home Living                          Prior Function            PT Goals (current goals can now be found in the care plan section) Acute Rehab PT Goals Patient Stated Goal: to get better PT Goal Formulation: With patient/family Time For Goal Achievement: 02/15/24 Potential to Achieve Goals: Good Progress towards PT goals: Progressing toward goals    Frequency    7X/week      PT Plan      Co-evaluation              AM-PAC PT 6 Clicks Mobility   Outcome Measure  Help needed turning from your back to your side while in a flat bed without using bedrails?: None Help needed moving from lying on your back to sitting on the side of a flat bed without using bedrails?: None Help needed moving to and from a bed to a chair (including a wheelchair)?: None Help needed standing up from a chair using your arms (e.g., wheelchair or bedside chair)?: A Little Help needed to walk in hospital room?: A Little Help needed climbing 3-5 steps with a railing? : A Little 6 Click Score: 21    End of Session Equipment Utilized During Treatment: Gait belt Activity Tolerance: Patient tolerated treatment well Patient left: in bed;with call bell/phone within reach;with bed alarm set;with family/visitor present Nurse Communication: Mobility status PT Visit Diagnosis: Other abnormalities of gait and mobility (R26.89);Muscle weakness (generalized) (M62.81)     Time: 8993-8971 PT Time Calculation (min) (ACUTE ONLY): 22 min  Charges:    $Gait Training: 8-22 mins PT General Charges $$ ACUTE PT VISIT: 1 Visit                     [name] DPT Acute Rehab Services [phone] Prefer contact via chat    [name] [name] 02/02/2024, 10:39 AM

## 2024-02-07 ENCOUNTER — Telehealth: Payer: Self-pay | Admitting: *Deleted

## 2024-02-07 ENCOUNTER — Other Ambulatory Visit: Payer: Self-pay | Admitting: Surgical

## 2024-02-07 MED ORDER — OXYCODONE HCL 5 MG PO TABS: 5.0000 mg | ORAL_TABLET | ORAL | 0 refills | Status: DC | PRN

## 2024-02-07 NOTE — Discharge Summary (Signed)
 Physician Discharge Summary      Patient ID: [name] MRN: [id number] DOB/AGE: April 08, 1955 69 y.o.  Admit date: 02/01/2024 Discharge date: 02/02/2024  Admission Diagnoses:  Principal Problem:   S/P total knee replacement, left   Discharge Diagnoses:  Same  Surgeries: Procedures: ARTHROPLASTY, KNEE, TOTAL, LEFT on 02/01/2024   Consultants:   Discharged Condition: Stable  Hospital Course: [name] is an 69 y.o. female who was admitted 02/01/2024 with a chief complaint of left knee pain, and found to have a diagnosis of left knee arthritis.  They were brought to the operating room on 02/01/2024 and underwent the above named procedures.  Pt awoke from anesthesia without complication and was transferred to the floor. On POD1, patient's pain was overall controlled.  Was able to mobilize well with physical therapy.  No red flag signs or symptoms throughout her stay.  She was discharged home on POD 1..  Pt will f/u with Dr. Addie in clinic in ~2 weeks.   Antibiotics given:  Anti-infectives (From admission, onward)    Start     Dose/Rate Route Frequency Ordered Stop   02/01/24 1600  ceFAZolin  (ANCEF ) IVPB 2g/100 mL premix        2 g 200 mL/hr over 30 Minutes Intravenous Every 8 hours 02/01/24 1040 02/02/24 0742   02/01/24 0915  vancomycin  (VANCOCIN ) powder  Status:  Discontinued          As needed 02/01/24 0915 02/01/24 1023   02/01/24 0600  ceFAZolin  (ANCEF ) IVPB 2g/100 mL premix        2 g 200 mL/hr over 30 Minutes Intravenous On call to O.R. 02/01/24 9451 02/01/24 0815     .  Recent vital signs:  Vitals:   02/02/24 0525 02/02/24 0800  BP: 95/60 (!) 99/58  Pulse: 63 67  Resp: 18 16  Temp: 97.7 F (36.5 C) 99.1 F (37.3 C)  SpO2: 98% 96%    Recent laboratory studies:  Results for orders placed or performed during the hospital encounter of 01/25/24  Surgical pcr screen   Collection Time: 01/25/24 10:19 AM   Specimen: Nasal Mucosa; Nasal Swab  Result Value Ref  Range   MRSA, PCR NEGATIVE NEGATIVE   Staphylococcus aureus NEGATIVE NEGATIVE  CBC   Collection Time: 01/25/24 10:37 AM  Result Value Ref Range   WBC 6.2 4.0 - 10.5 K/uL   RBC 4.46 3.87 - 5.11 MIL/uL   Hemoglobin 14.1 12.0 - 15.0 g/dL   HCT 58.4 63.9 - 53.9 %   MCV 93.0 80.0 - 100.0 fL   MCH 31.6 26.0 - 34.0 pg   MCHC 34.0 30.0 - 36.0 g/dL   RDW 87.3 88.4 - 84.4 %   Platelets 257 150 - 400 K/uL   nRBC 0.0 0.0 - 0.2 %  Basic metabolic panel   Collection Time: 01/25/24 10:37 AM  Result Value Ref Range   Sodium 136 135 - 145 mmol/L   Potassium 3.8 3.5 - 5.1 mmol/L   Chloride 100 98 - 111 mmol/L   CO2 27 22 - 32 mmol/L   Glucose, Bld 90 70 - 99 mg/dL   BUN 12 8 - 23 mg/dL   Creatinine, Ser 9.35 0.44 - 1.00 mg/dL   Calcium 9.3 8.9 - 89.6 mg/dL   GFR, Estimated >39 >39 mL/min   Anion gap 10 5 - 15  Urinalysis, w/ Reflex to Culture (Infection Suspected) -Urine, Clean Catch   Collection Time: 01/25/24 10:37 AM  Result Value Ref Range  Specimen Source URINE, CLEAN CATCH    Color, Urine STRAW (A) YELLOW   APPearance CLEAR CLEAR   Specific Gravity, Urine 1.008 1.005 - 1.030   pH 7.0 5.0 - 8.0   Glucose, UA NEGATIVE NEGATIVE mg/dL   Hgb urine dipstick NEGATIVE NEGATIVE   Bilirubin Urine NEGATIVE NEGATIVE   Ketones, ur NEGATIVE NEGATIVE mg/dL   Protein, ur NEGATIVE NEGATIVE mg/dL   Nitrite NEGATIVE NEGATIVE   Leukocytes,Ua NEGATIVE NEGATIVE   RBC / HPF 0-5 0 - 5 RBC/hpf   WBC, UA 0-5 0 - 5 WBC/hpf   Bacteria, UA NONE SEEN NONE SEEN   Squamous Epithelial / HPF 0-5 0 - 5 /HPF    Discharge Medications:   Allergies as of 02/02/2024       Reactions   Adderall Xr [amphetamine-dextroamphet Er]    No benefit with use   Pravastatin Sodium    Other reaction(s): leg cramps   Strattera [atomoxetine]    Intolerant.    Zilretta  [triamcinolone  Acetonide]    Mood changes.          Medication List     STOP taking these medications    traMADol  50 MG tablet Commonly known as:  ULTRAM        TAKE these medications    acetaminophen  325 MG tablet Commonly known as: TYLENOL  Take 1-2 tablets (325-650 mg total) by mouth every 6 (six) hours as needed for mild pain (pain score 1-3) or fever (or temp > 100.5).   ALPRAZolam  0.5 MG tablet Commonly known as: XANAX  Take 1 tablet (0.5 mg total) by mouth at bedtime as needed for anxiety. What changed: when to take this   Aspirin  Low Dose 81 MG chewable tablet Generic drug: aspirin  Chew 1 tablet (81 mg total) by mouth 2 (two) times daily.   docusate sodium  100 MG capsule Commonly known as: COLACE Take 1 capsule (100 mg total) by mouth 2 (two) times daily.   escitalopram  20 MG tablet Commonly known as: LEXAPRO  Take 1 tablet (20 mg total) by mouth daily. What changed: when to take this   ibuprofen  200 MG tablet Commonly known as: ADVIL  Take 400 mg by mouth every 8 (eight) hours as needed (pain). Reports taking 2-3 times daily   lovastatin  10 MG tablet Commonly known as: MEVACOR  Take 1 tablet (10 mg total) by mouth at bedtime.   methocarbamol  500 MG tablet Commonly known as: ROBAXIN  Take 1 tablet (500 mg total) by mouth every 8 (eight) hours as needed for muscle spasms.   Vitamin D3 50 MCG (2000 UT) capsule Take 1 capsule (2,000 Units total) by mouth daily.        Diagnostic Studies: No results found.  Disposition: Discharge disposition: 01-Home or Self Care       Discharge Instructions     Call MD / Call 911   Complete by: As directed    If you experience chest pain or shortness of breath, CALL 911 and be transported to the hospital emergency room.  If you develope a fever above 101 F, pus (white drainage) or increased drainage or redness at the wound, or calf pain, call your surgeon's office.   Constipation Prevention   Complete by: As directed    Drink plenty of fluids.  Prune juice may be helpful.  You may use a stool softener, such as Colace (over the counter) 100 mg twice a day.  Use  MiraLax (over the counter) for constipation as needed.   Discharge instructions   Complete by:  As directed    You may shower, dressing is waterproof.  Do not remove the dressing, we will remove it at your first post-op appointment.  Do not take a bath or soak the knee in a tub or pool.  You may weightbear as you can tolerate on the operative leg with a walker.  Continue using the CPM machine 3 times per day for one hour each time, increasing the degrees of range of motion daily.  Use the blue cradle boot under your heel to work on getting your leg straight.  Do NOT put a pillow under your knee.  You will follow-up with Dr. Addie in the clinic in 2 weeks at your given appointment date.    INSTRUCTIONS AFTER JOINT REPLACEMENT   Remove items at home which could result in a fall. This includes throw rugs or furniture in walking pathways ICE to the affected joint every three hours while awake for 30 minutes at a time, for at least the first 3-5 days, and then as needed for pain and swelling.  Continue to use ice for pain and swelling. You may notice swelling that will progress down to the foot and ankle.  This is normal after surgery.  Elevate your leg when you are not up walking on it.   Continue to use the breathing machine you got in the hospital (incentive spirometer) which will help keep your temperature down.  It is common for your temperature to cycle up and down following surgery, especially at night when you are not up moving around and exerting yourself.  The breathing machine keeps your lungs expanded and your temperature down.   DIET:  As you were doing prior to hospitalization, we recommend a well-balanced diet.  DRESSING / WOUND CARE / SHOWERING  Keep the surgical dressing until follow up.  The dressing is water  proof, so you can shower without any extra covering.  IF THE DRESSING FALLS OFF or the wound gets wet inside, change the dressing with sterile gauze.  Please use good hand washing  techniques before changing the dressing.  Do not use any lotions or creams on the incision until instructed by your surgeon.    ACTIVITY  Increase activity slowly as tolerated, but follow the weight bearing instructions below.   No driving for 6 weeks or until further direction given by your physician.  You cannot drive while taking narcotics.  No lifting or carrying greater than 10 lbs. until further directed by your surgeon. Avoid periods of inactivity such as sitting longer than an hour when not asleep. This helps prevent blood clots.  You may return to work once you are authorized by your doctor.     WEIGHT BEARING   Weight bearing as tolerated with assist device (walker, cane, etc) as directed, use it as long as suggested by your surgeon or therapist, typically at least 4-6 weeks.   EXERCISES  Results after joint replacement surgery are often greatly improved when you follow the exercise, range of motion and muscle strengthening exercises prescribed by your doctor. Safety measures are also important to protect the joint from further injury. Any time any of these exercises cause you to have increased pain or swelling, decrease what you are doing until you are comfortable again and then slowly increase them. If you have problems or questions, call your caregiver or physical therapist for advice.   Rehabilitation is important following a joint replacement. After just a few days of immobilization, the muscles of the leg  can become weakened and shrink (atrophy).  These exercises are designed to build up the tone and strength of the thigh and leg muscles and to improve motion. Often times heat used for twenty to thirty minutes before working out will loosen up your tissues and help with improving the range of motion but do not use heat for the first two weeks following surgery (sometimes heat can increase post-operative swelling).   These exercises can be done on a training (exercise) mat, on  the floor, on a table or on a bed. Use whatever works the best and is most comfortable for you.    Use music or television while you are exercising so that the exercises are a pleasant break in your day. This will make your life better with the exercises acting as a break in your routine that you can look forward to.   Perform all exercises about fifteen times, three times per day or as directed.  You should exercise both the operative leg and the other leg as well.  Exercises include:   Quad Sets - Tighten up the muscle on the front of the thigh (Quad) and hold for 5-10 seconds.   Straight Leg Raises - With your knee straight (if you were given a brace, keep it on), lift the leg to 60 degrees, hold for 3 seconds, and slowly lower the leg.  Perform this exercise against resistance later as your leg gets stronger.  Leg Slides: Lying on your back, slowly slide your foot toward your buttocks, bending your knee up off the floor (only go as far as is comfortable). Then slowly slide your foot back down until your leg is flat on the floor again.  Angel Wings: Lying on your back spread your legs to the side as far apart as you can without causing discomfort.  Hamstring Strength:  Lying on your back, push your heel against the floor with your leg straight by tightening up the muscles of your buttocks.  Repeat, but this time bend your knee to a comfortable angle, and push your heel against the floor.  You may put a pillow under the heel to make it more comfortable if necessary.   A rehabilitation program following joint replacement surgery can speed recovery and prevent re-injury in the future due to weakened muscles. Contact your doctor or a physical therapist for more information on knee rehabilitation.    CONSTIPATION  Constipation is defined medically as fewer than three stools per week and severe constipation as less than one stool per week.  Even if you have a regular bowel pattern at home, your normal  regimen is likely to be disrupted due to multiple reasons following surgery.  Combination of anesthesia, postoperative narcotics, change in appetite and fluid intake all can affect your bowels.   YOU MUST use at least one of the following options; they are listed in order of increasing strength to get the job done.  They are all available over the counter, and you may need to use some, POSSIBLY even all of these options:    Drink plenty of fluids (prune juice may be helpful) and high fiber foods Colace 100 mg by mouth twice a day  Senokot for constipation as directed and as needed Dulcolax (bisacodyl), take with full glass of water   Miralax (polyethylene glycol) once or twice a day as needed.  If you have tried all these things and are unable to have a bowel movement in the first 3-4 days after surgery  call either your surgeon or your primary doctor.    If you experience loose stools or diarrhea, hold the medications until you stool forms back up.  If your symptoms do not get better within 1 week or if they get worse, check with your doctor.  If you experience the worst abdominal pain ever or develop nausea or vomiting, please contact the office immediately for further recommendations for treatment.   ITCHING:  If you experience itching with your medications, try taking only a single pain pill, or even half a pain pill at a time.  You can also use Benadryl over the counter for itching or also to help with sleep.   TED HOSE STOCKINGS:  Use stockings on both legs until for at least 2 weeks or as directed by physician office. They may be removed at night for sleeping.  MEDICATIONS:  See your medication summary on the After Visit Summary that nursing will review with you.  You may have some home medications which will be placed on hold until you complete the course of blood thinner medication.  It is important for you to complete the blood thinner medication as prescribed.  PRECAUTIONS:  If you  experience chest pain or shortness of breath - call 911 immediately for transfer to the hospital emergency department.   If you develop a fever greater that 101 F, purulent drainage from wound, increased redness or drainage from wound, foul odor from the wound/dressing, or calf pain - CONTACT YOUR SURGEON.                                                   FOLLOW-UP APPOINTMENTS:  If you do not already have a post-op appointment, please call the office for an appointment to be seen by your surgeon.  Guidelines for how soon to be seen are listed in your After Visit Summary, but are typically between 1-4 weeks after surgery.  OTHER INSTRUCTIONS:   Knee Replacement:  Do not place pillow under knee, focus on keeping the knee straight while resting. CPM instructions: 0-90 degrees, 2 hours in the morning, 2 hours in the afternoon, and 2 hours in the evening. Place foam block, curve side up under heel at all times except when in CPM or when walking.  DO NOT modify, tear, cut, or change the foam block in any way.  POST-OPERATIVE OPIOID TAPER INSTRUCTIONS: It is important to wean off of your opioid medication as soon as possible. If you do not need pain medication after your surgery it is ok to stop day one. Opioids include: Codeine, Hydrocodone(Norco, Vicodin), Oxycodone (Percocet, oxycontin ) and hydromorphone  amongst others.  Long term and even short term use of opiods can cause: Increased pain response Dependence Constipation Depression Respiratory depression And more.  Withdrawal symptoms can include Flu like symptoms Nausea, vomiting And more Techniques to manage these symptoms Hydrate well Eat regular healthy meals Stay active Use relaxation techniques(deep breathing, meditating, yoga) Do Not substitute Alcohol to help with tapering If you have been on opioids for less than two weeks and do not have pain than it is ok to stop all together.  Plan to wean off of opioids This plan should  start within one week post op of your joint replacement. Maintain the same interval or time between taking each dose and first decrease the dose.  Cut the  total daily intake of opioids by one tablet each day Next start to increase the time between doses. The last dose that should be eliminated is the evening dose.   MAKE SURE YOU:  Understand these instructions.  Get help right away if you are not doing well or get worse.    Thank you for letting us  be a part of your medical care team.  It is a privilege we respect greatly.  We hope these instructions will help you stay on track for a fast and full recovery!    Dental Antibiotics:  In most cases prophylactic antibiotics for Dental procdeures after total joint surgery are not necessary.  Exceptions are as follows:  1. History of prior total joint infection  2. Severely immunocompromised (Organ Transplant, cancer chemotherapy, Rheumatoid biologic meds such as Humera)  3. Poorly controlled diabetes (A1C &gt; 8.0, blood glucose over 200)  If you have one of these conditions, contact your surgeon for an antibiotic prescription, prior to your dental procedure.   Increase activity slowly as tolerated   Complete by: As directed    Post-operative opioid taper instructions:   Complete by: As directed    POST-OPERATIVE OPIOID TAPER INSTRUCTIONS: It is important to wean off of your opioid medication as soon as possible. If you do not need pain medication after your surgery it is ok to stop day one. Opioids include: Codeine, Hydrocodone(Norco, Vicodin), Oxycodone (Percocet, oxycontin ) and hydromorphone  amongst others.  Long term and even short term use of opiods can cause: Increased pain response Dependence Constipation Depression Respiratory depression And more.  Withdrawal symptoms can include Flu like symptoms Nausea, vomiting And more Techniques to manage these symptoms Hydrate well Eat regular healthy meals Stay active Use  relaxation techniques(deep breathing, meditating, yoga) Do Not substitute Alcohol to help with tapering If you have been on opioids for less than two weeks and do not have pain than it is ok to stop all together.  Plan to wean off of opioids This plan should start within one week post op of your joint replacement. Maintain the same interval or time between taking each dose and first decrease the dose.  Cut the total daily intake of opioids by one tablet each day Next start to increase the time between doses. The last dose that should be eliminated is the evening dose.           Contact information for follow-up providers     Addie, Cordella Hamilton, MD. Go on 02/16/2024.   Specialty: Orthopedic Surgery Why: at 10:45 am for your first in office post op appointment with Dr. Dean/Luke after discharge Contact information: [address] [place] [address] [phone]         Monica Arlyss RAMAN, MD Follow up.   Specialty: Family Medicine Why: home health services will be provided by [facility], start of care within 48 hours post discharge Contact information: [address] [place] [address] [phone]              Contact information for after-discharge care     Home Medical Care     [facility]) .   Service: Home Health Services Contact information: [address] [place] [address] [place]  [address] [phone]                      Signed: Herlene Calix 02/07/2024, 1:43 PM

## 2024-02-07 NOTE — Telephone Encounter (Signed)
 Patient called and is doing well. Requested refill of pain medication before the weekend. Thank you. Pharmacy on file.

## 2024-02-07 NOTE — Telephone Encounter (Signed)
 Pain medication refilled.  Thanks [employer]

## 2024-02-07 NOTE — Telephone Encounter (Signed)
 Patient aware of refill sent in.

## 2024-02-10 DIAGNOSIS — M1712 Unilateral primary osteoarthritis, left knee: Secondary | ICD-10-CM

## 2024-02-16 ENCOUNTER — Other Ambulatory Visit (HOSPITAL_COMMUNITY): Payer: Self-pay

## 2024-02-16 ENCOUNTER — Ambulatory Visit: Admitting: Pharmacist

## 2024-02-16 ENCOUNTER — Ambulatory Visit: Payer: Medicare (Managed Care) | Admitting: Surgical

## 2024-02-16 ENCOUNTER — Other Ambulatory Visit: Payer: Self-pay | Admitting: Surgical

## 2024-02-16 ENCOUNTER — Telehealth: Payer: Self-pay | Admitting: Surgical

## 2024-02-16 ENCOUNTER — Ambulatory Visit (HOSPITAL_COMMUNITY): Admission: RE | Admit: 2024-02-16 | Source: Ambulatory Visit

## 2024-02-16 ENCOUNTER — Ambulatory Visit: Payer: Self-pay

## 2024-02-16 ENCOUNTER — Telehealth: Payer: Self-pay | Admitting: Radiology

## 2024-02-16 VITALS — BP 143/93 | HR 79 | Wt 143.0 lb

## 2024-02-16 DIAGNOSIS — M79605 Pain in left leg: Secondary | ICD-10-CM

## 2024-02-16 DIAGNOSIS — Z96652 Presence of left artificial knee joint: Secondary | ICD-10-CM

## 2024-02-16 DIAGNOSIS — M7989 Other specified soft tissue disorders: Secondary | ICD-10-CM

## 2024-02-16 DIAGNOSIS — I82442 Acute embolism and thrombosis of left tibial vein: Secondary | ICD-10-CM

## 2024-02-16 MED ORDER — DABIGATRAN ETEXILATE MESYLATE 150 MG PO CAPS: 150.0000 mg | ORAL_CAPSULE | Freq: Two times a day (BID) | ORAL | 0 refills | Status: AC

## 2024-02-16 MED ORDER — OXYCODONE HCL 5 MG PO TABS: 5.0000 mg | ORAL_TABLET | Freq: Four times a day (QID) | ORAL | 0 refills | Status: AC | PRN

## 2024-02-16 MED ORDER — METHOCARBAMOL 500 MG PO TABS: 500.0000 mg | ORAL_TABLET | Freq: Three times a day (TID) | ORAL | 0 refills | Status: AC | PRN

## 2024-02-16 MED ORDER — ELIQUIS DVT/PE STARTER PACK 5 MG PO TBPK
ORAL_TABLET | ORAL | 0 refills | Status: AC
  Filled 2024-02-16: qty 74, 30d supply, fill #0

## 2024-02-16 NOTE — Telephone Encounter (Signed)
 Patient sent My Chart message as well-this has been sent urgently to [facility].

## 2024-02-16 NOTE — Patient Instructions (Signed)
-  Start apixaban  (Eliquis ) 10 mg twice daily for 7 days followed by 5 mg twice daily. After completion of the Eliquis  starter pack, start taking Pradaxa  150 mg twice daily.  -Your refills for Pradaxa  have been sent to [facility].  -It is important to take your medication around the same time every day.  -Avoid NSAIDs like ibuprofen  (Advil , Motrin ) and naproxen (Aleve) as well as aspirin  doses over 100 mg daily. -Tylenol  (acetaminophen ) is the preferred over the counter pain medication to lower the risk of bleeding. -Be sure to alert all of your health care providers that you are taking an anticoagulant prior to starting a new medication or having a procedure. -Monitor for signs and symptoms of bleeding (abnormal bruising, prolonged bleeding, nose bleeds, bleeding from gums, discolored urine, black tarry stools). If you have fallen and hit your head OR if your bleeding is severe or not stopping, seek emergency care.  -Go to the emergency room if emergent signs and symptoms of new clot occur (new or worse swelling and pain in an arm or leg, shortness of breath, chest pain, fast or irregular heartbeats, lightheadedness, dizziness, fainting, coughing up blood) or if you experience a significant color change (pale or blue) in the extremity that has the DVT.  -We recommend you wear compression stockings (20-30 mmHg) as long as you are having swelling or pain. Be sure to purchase the correct size and take them off at night.   If you have any questions or need to reschedule an appointment, please call [phone]. If you are having an emergency, call 911 or present to the nearest emergency room.   What is a DVT?  -Deep vein thrombosis (DVT) is a condition in which a blood clot forms in a vein of the deep venous system which can occur in the lower leg, thigh, pelvis, arm, or neck. This condition is serious and can be life-threatening if the clot travels to the arteries of the lungs and  causing a blockage (pulmonary embolism, PE). A DVT can also damage veins in the leg, which can lead to long-term venous disease, leg pain, swelling, discoloration, and ulcers or sores (post-thrombotic syndrome).  -Treatment may include taking an anticoagulant medication to prevent more clots from forming and the current clot from growing, wearing compression stockings, and/or surgical procedures to remove or dissolve the clot.

## 2024-02-16 NOTE — Progress Notes (Signed)
 " DVT Clinic Note  Name: [name]     MRN: [id number]     DOB: 1955/04/15     Sex: female  PCP: Cleatus Arlyss RAMAN, MD  Today's Visit: Visit Information: Initial Visit  Referred to DVT Clinic by: Orthopedic Surgery - Carlin Calix, PA-C Referred to CPP by: Dr. Pearline Reason for referral:  Chief Complaint  Patient presents with   DVT   HISTORY OF PRESENT ILLNESS: [name] is a 69 y.o. female who presents after diagnosis of DVT for medication management. She is s/p left TKA on 02/01/24. She was ortho for follow up this morning and an ultrasound was ordered to evaluate for DVT due to her left calf pain and swelling. Venous ultrasound showed acute DVT involving the left posterior tibial and peroneal veins. She was referred to DVT Clinic to start treatment. Patient presents to clinic accompanied by her daughter, [name]. Reports that shortly after knee replacement surgery she began to have calf pain and swelling. Since surgery she has been taking aspirin  81 mg BID for DVT prophylaxis. Denies prior history of DVT. Denies chest pain and SOB.  Positive Thrombotic Risk Factors: Recent surgery (within 3 months) Bleeding Risk Factors: Age >65 years  Negative Thrombotic Risk Factors: Previous VTE, Recent trauma (within 3 months), Recent admission to hospital with acute illness (within 3 months), Paralysis, paresis, or recent plaster cast immobilization of lower extremity, Central venous catheterization, Sedentary journey lasting >8 hours within 4 weeks, Pregnancy, Estrogen therapy, Recent cesarean section (within 3 months), Within 6 weeks postpartum, Bed rest >72 hours within 3 months, Testosterone therapy, Recent COVID diagnosis (within 3 months), Erythropoiesis-stimulating agent, Non-malignant, chronic inflammatory condition, Known thrombophilic condition, Smoking, Active cancer, Obesity  Rx Insurance Coverage: Medicare Rx Affordability: Eliquis  is $249 for 30 days and $379 for 90 days which is  unaffordable. Pradaxa  is $81 for 30 days or $240 for 90 days. However, price for Pradaxa  is cheaper through [facility] (~$24 per 30 days). Rx Assistance Provided: Free 30-day trial card Preferred Pharmacy: Eliquis  starter pack filled at [facility]. Pradaxa  refills sent to [facility].  Past Medical History:  Diagnosis Date   ADHD (attention deficit hyperactivity disorder)    Allergy    Seasonal allergies   Anxiety    Arthritis 2010   In hands and knee   Depression 2005   History of asthma    as a child grew out of it   History of pneumonia as a child    Hyperlipidemia     Past Surgical History:  Procedure Laterality Date   APPENDECTOMY     HEEL SPUR SURGERY Bilateral    TOTAL KNEE ARTHROPLASTY Left 02/01/2024   Procedure: ARTHROPLASTY, KNEE, TOTAL, LEFT;  Surgeon: Addie Cordella Hamilton, MD;  Location: [facility] OR;  Service: Orthopedics;  Laterality: Left;    Social History   Socioeconomic History   Marital status: Divorced    Spouse name: Not on file   Number of children: Not on file   Years of education: Not on file   Highest education level: Some college, no degree  Occupational History   Not on file  Tobacco Use   Smoking status: Every Day    Current packs/day: 0.50    Average packs/day: 0.5 packs/day for 40.0 years (20.0 ttl pk-yrs)    Types: Cigarettes   Smokeless tobacco: Never  Vaping Use   Vaping status: Never Used  Substance and Sexual Activity  Alcohol use: Not Currently    Alcohol/week: 1.0 standard drink of alcohol    Types: 1 Standard drinks or equivalent per week    Comment: Drink in social situations   Drug use: Not Currently    Types: Marijuana    Comment: last use 01/10/2024   Sexual activity: Not Currently    Birth control/protection: Post-menopausal  Other Topics Concern   Not on file  Social History Narrative   [employer] fan.     Divorced, lives alone.     Social Drivers of Health   Tobacco Use:  High Risk (02/01/2024)   Patient History    Smoking Tobacco Use: Every Day    Smokeless Tobacco Use: Never    Passive Exposure: Not on file  Financial Resource Strain: Low Risk (11/19/2023)   Overall Financial Resource Strain (CARDIA)    Difficulty of Paying Living Expenses: Not very hard  Food Insecurity: No Food Insecurity (02/01/2024)   Epic    Worried About Radiation Protection Practitioner of Food in the Last Year: Never true    Ran Out of Food in the Last Year: Never true  Transportation Needs: No Transportation Needs (02/01/2024)   Epic    Lack of Transportation (Medical): No    Lack of Transportation (Non-Medical): No  Physical Activity: Sufficiently Active (11/19/2023)   Exercise Vital Sign    Days of Exercise per Week: 7 days    Minutes of Exercise per Session: 40 min  Recent Concern: Physical Activity - Insufficiently Active (09/21/2023)   Exercise Vital Sign    Days of Exercise per Week: 7 days    Minutes of Exercise per Session: 20 min  Stress: Stress Concern Present (11/19/2023)   [employer] of Occupational Health - Occupational Stress Questionnaire    Feeling of Stress: To some extent  Social Connections: Socially Isolated (02/01/2024)   Social Connection and Isolation Panel    Frequency of Communication with Friends and Family: More than three times a week    Frequency of Social Gatherings with Friends and Family: More than three times a week    Attends Religious Services: Never    Database Administrator or Organizations: No    Attends Banker Meetings: Never    Marital Status: Divorced  Catering Manager Violence: Not At Risk (02/01/2024)   Epic    Fear of Current or Ex-Partner: No    Emotionally Abused: No    Physically Abused: No    Sexually Abused: No  Depression (PHQ2-9): Medium Risk (11/23/2023)   Depression (PHQ2-9)    PHQ-2 Score: 6  Alcohol Screen: Low Risk (11/19/2023)   Alcohol Screen    Last Alcohol Screening Score (AUDIT): 1  Housing: Low Risk  (02/01/2024)   Epic    Unable to Pay for Housing in the Last Year: No    Number of Times Moved in the Last Year: 0    Homeless in the Last Year: No  Utilities: Not At Risk (02/01/2024)   Epic    Threatened with loss of utilities: No  Health Literacy: Not on file    Family History  Problem Relation Age of Onset   Stroke Mother    Anxiety disorder Mother    Depression Mother    Hypertension Mother    Pulmonary fibrosis Father    Heart disease Father    Ulcerative colitis Father    Arthritis Sister    Hypertension Sister    Arthritis Maternal Grandmother    Alcohol abuse Maternal Grandfather  Arthritis Paternal Grandmother    ADD / ADHD Daughter    Cancer Maternal Uncle    Alcohol abuse Paternal Uncle    Vision loss Paternal Uncle    Colon cancer Neg Hx    Breast cancer Neg Hx     Allergies as of 02/16/2024 - Review Complete 02/16/2024  Allergen Reaction Noted   Adderall xr [amphetamine-dextroamphet er]  11/23/2023   Pravastatin sodium  12/11/2020   Strattera [atomoxetine]  11/23/2023   Zilretta  [triamcinolone  acetonide]  11/23/2023    Medications Ordered Prior to Encounter[1] REVIEW OF SYSTEMS:  Review of Systems  Respiratory:  Negative for shortness of breath.   Cardiovascular:  Positive for leg swelling. Negative for chest pain.  Musculoskeletal:  Negative for myalgias.   PHYSICAL EXAMINATION:  Vitals:   02/16/24 1344  BP: (!) 143/93  Pulse: 79  SpO2: 98%  Weight: 143 lb (64.9 kg)    Body mass index is 25.74 kg/m.  Physical Exam Vitals reviewed.  Pulmonary:     Effort: Pulmonary effort is normal.  Musculoskeletal:        General: Tenderness present.     Left lower leg: Edema present.  Neurological:     Mental Status: She is alert.  Psychiatric:        Mood and Affect: Mood normal.    Villalta Score for Post-Thrombotic Syndrome: Pain: Mild Cramps: Absent Heaviness: Absent Paresthesia: Absent Pruritus: Absent Pretibial Edema: Mild Skin  Induration: Absent Hyperpigmentation: Absent Redness: Absent Venous Ectasia: Absent Pain on calf compression: Mild Villalta Preliminary Score: 3 Is venous ulcer present?: No If venous ulcer is present and score is <15, then 15 points total are assigned: Absent Villalta Total Score: 3  LABS:  CBC     Component Value Date/Time   WBC 6.2 01/25/2024 1037   RBC 4.46 01/25/2024 1037   HGB 14.1 01/25/2024 1037   HCT 41.5 01/25/2024 1037   PLT 257 01/25/2024 1037   MCV 93.0 01/25/2024 1037   MCH 31.6 01/25/2024 1037   MCHC 34.0 01/25/2024 1037   RDW 12.6 01/25/2024 1037   LYMPHSABS 1.7 11/14/2023 0838   MONOABS 0.5 11/14/2023 0838   EOSABS 0.1 11/14/2023 0838   BASOSABS 0.0 11/14/2023 0838    Hepatic Function      Component Value Date/Time   PROT 7.3 11/14/2023 0838   ALBUMIN 4.2 11/14/2023 0838   AST 19 11/14/2023 0838   ALT 13 11/14/2023 0838   ALKPHOS 57 11/14/2023 0838   BILITOT 0.4 11/14/2023 0838    Renal Function   Lab Results  Component Value Date   CREATININE 0.64 01/25/2024   CREATININE 0.69 11/14/2023   CREATININE 0.67 06/20/2022    CrCl cannot be calculated (Patient's most recent lab result is older than the maximum 21 days allowed.).   [facility] Vascular Lab Studies:  Summary:  RIGHT:  - No evidence of common femoral vein obstruction.    LEFT:  - Findings consistent with acute deep vein thrombosis involving the left  posterior tibial veins, and left peroneal veins.    - No cystic structure found in the popliteal fossa.   ASSESSMENT: Location of DVT: Left distal vein Cause of DVT: provoked by a transient risk factor  Patient without prior history of DVT diagnosed with acute DVT involving the left posterior tibial and peroneal veins. Provoking factors include TKA on 02/01/24. Indicated to start on anticoagulation. Will plan to continue anticoagulation for a total of 3 months for first provoked DVT. She will start the Eliquis   starter pack today which she can  fill using the 1 month free card. Eliquis  refills on her insurance are unaffordable due to her deductible. Will plan to switch to Pradaxa  for the remaining 2 months of treatment after finishing the Eliquis  starter pack due to cost. She can fill Pradaxa  through [facility] for ~$24 for a 1 month supply which is affordable for her. Extensively counseled on Eliquis  and Pradaxa . All medication adherence barriers were addressed. Counseled on compression and elevation to help with swelling and she has compression stockings at home. Instructed her to discontinue aspirin  which she was taking for DVT prophylaxis post surgery.  PLAN: -Start apixaban  (Eliquis ) 10 mg twice daily for 7 days followed by 5 mg twice daily After completion of Eliquis  starter pack, start taking dabigatran  150 mg twice daily for the remainder of treatment. -Expected duration of therapy: 3 months. Therapy started on 02/16/2024. -Patient educated on purpose, proper use and potential adverse effects of apixaban  (Eliquis ) dabigatran  (Pradaxa ). -Discussed importance of taking medication around the same time every day. -Advised patient of medications to avoid (NSAIDs, aspirin  doses >100 mg daily). -Educated that Tylenol  (acetaminophen ) is the preferred analgesic to lower the risk of bleeding. -Advised patient to alert all providers of anticoagulation therapy prior to starting a new medication or having a procedure. -Emphasized importance of monitoring for signs and symptoms of bleeding (abnormal bruising, prolonged bleeding, nose bleeds, bleeding from gums, discolored urine, black tarry stools). -Educated patient to present to the ED if emergent signs and symptoms of new thrombosis occur. -Counseled patient to wear compression stockings daily, removing at night.  Follow up: DVT Clinic in 3 months  Izetta Henry, PharmD, CPP [facility] [phone]      [1]  Current  Outpatient Medications on File Prior to Visit  Medication Sig Dispense Refill   acetaminophen  (TYLENOL ) 325 MG tablet Take 1-2 tablets (325-650 mg total) by mouth every 6 (six) hours as needed for mild pain (pain score 1-3) or fever (or temp > 100.5). 60 tablet 0   escitalopram  (LEXAPRO ) 20 MG tablet Take 1 tablet (20 mg total) by mouth daily. 90 tablet 4   ibuprofen  (ADVIL ) 200 MG tablet Take 400 mg by mouth every 8 (eight) hours as needed (pain). Reports taking 2-3 times daily     lovastatin  (MEVACOR ) 10 MG tablet Take 1 tablet (10 mg total) by mouth at bedtime. 90 tablet 3   methocarbamol  (ROBAXIN ) 500 MG tablet Take 1 tablet (500 mg total) by mouth every 8 (eight) hours as needed for muscle spasms. 30 tablet 0   oxyCODONE  (OXY IR/ROXICODONE ) 5 MG immediate release tablet Take 1 tablet (5 mg total) by mouth every 4 (four) hours as needed for moderate pain (pain score 4-6) (pain score 4-6). 30 tablet 0   ALPRAZolam  (XANAX ) 0.5 MG tablet Take 1 tablet (0.5 mg total) by mouth at bedtime as needed for anxiety. (Patient taking differently: Take 0.5 mg by mouth at bedtime.) 30 tablet 5   Cholecalciferol (VITAMIN D3) 50 MCG (2000 UT) capsule Take 1 capsule (2,000 Units total) by mouth daily.     docusate sodium  (COLACE) 100 MG capsule Take 1 capsule (100 mg total) by mouth 2 (two) times daily. 10 capsule 0   No current facility-administered medications on file prior to visit.   "

## 2024-02-16 NOTE — Telephone Encounter (Signed)
 R[facility] Rattler PT called back and stated that they have an appointment for Monday morning that is available. She is going to reach out to patient and see if she can get her scheduled.

## 2024-02-16 NOTE — Telephone Encounter (Signed)
 Pt called saying that the prescriptions haven't been called into the pharmacy yet and is wondering if they will be called in today Pharm[facility] is [facility] [address] in [place]. Call back number is [phone].

## 2024-03-15 ENCOUNTER — Encounter: Admitting: Surgical

## 2024-05-08 ENCOUNTER — Ambulatory Visit: Admitting: Pharmacist
